# Patient Record
Sex: Female | Born: 1990 | Race: Black or African American | Hispanic: No | Marital: Single | Smoking: Never smoker
Health system: Southern US, Community
[De-identification: ages and names within clinical notes are randomized; demographics above are authoritative.]

## PROBLEM LIST (undated history)

## (undated) ENCOUNTER — Inpatient Hospital Stay (HOSPITAL_COMMUNITY): Payer: Self-pay

## (undated) DIAGNOSIS — O1495 Unspecified pre-eclampsia, complicating the puerperium: Secondary | ICD-10-CM

## (undated) DIAGNOSIS — O139 Gestational [pregnancy-induced] hypertension without significant proteinuria, unspecified trimester: Secondary | ICD-10-CM

## (undated) HISTORY — PX: MOLE REMOVAL: SHX2046

## (undated) HISTORY — PX: NO PAST SURGERIES: SHX2092

---
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
## 2011-09-05 ENCOUNTER — Other Ambulatory Visit: Payer: Self-pay | Admitting: Family Medicine

## 2011-09-05 ENCOUNTER — Other Ambulatory Visit (HOSPITAL_COMMUNITY)
Admission: RE | Admit: 2011-09-05 | Discharge: 2011-09-05 | Disposition: A | Discharge status: Home / Self Care | Payer: 59 | Source: Ambulatory Visit | Attending: Family Medicine | Admitting: Family Medicine

## 2011-09-05 DIAGNOSIS — Z124 Encounter for screening for malignant neoplasm of cervix: Secondary | ICD-10-CM | POA: Insufficient documentation

## 2011-09-05 DIAGNOSIS — Z113 Encounter for screening for infections with a predominantly sexual mode of transmission: Secondary | ICD-10-CM | POA: Insufficient documentation

## 2011-10-03 ENCOUNTER — Other Ambulatory Visit: Payer: Self-pay | Admitting: Family Medicine

## 2012-10-03 ENCOUNTER — Other Ambulatory Visit (HOSPITAL_COMMUNITY)
Admission: RE | Admit: 2012-10-03 | Discharge: 2012-10-03 | Disposition: A | Discharge status: Home / Self Care | Payer: 59 | Source: Ambulatory Visit | Attending: Family Medicine | Admitting: Family Medicine

## 2012-10-03 ENCOUNTER — Other Ambulatory Visit: Payer: Self-pay | Admitting: Family Medicine

## 2012-10-03 DIAGNOSIS — Z113 Encounter for screening for infections with a predominantly sexual mode of transmission: Secondary | ICD-10-CM | POA: Insufficient documentation

## 2012-10-03 DIAGNOSIS — Z124 Encounter for screening for malignant neoplasm of cervix: Secondary | ICD-10-CM | POA: Insufficient documentation

## 2013-01-10 ENCOUNTER — Ambulatory Visit (INDEPENDENT_AMBULATORY_CARE_PROVIDER_SITE_OTHER): Payer: 59 | Admitting: Gynecology

## 2013-01-10 ENCOUNTER — Encounter: Payer: Self-pay | Admitting: Gynecology

## 2013-01-10 VITALS — BP 128/80 | Ht 63.0 in | Wt 164.0 lb

## 2013-01-10 DIAGNOSIS — Z113 Encounter for screening for infections with a predominantly sexual mode of transmission: Secondary | ICD-10-CM

## 2013-01-10 DIAGNOSIS — N9089 Other specified noninflammatory disorders of vulva and perineum: Secondary | ICD-10-CM

## 2013-01-10 MED ORDER — FLUCONAZOLE 100 MG PO TABS: ORAL_TABLET | ORAL | Status: DC

## 2013-01-10 NOTE — Addendum Note (Signed)
Addended by: Ok Edwards on: 01/10/2013 04:52 PM   Modules accepted: Orders

## 2013-01-10 NOTE — Patient Instructions (Addendum)
Genital Herpes  Genital herpes is a sexually transmitted disease. This means that it is a disease passed by having sex with an infected person. There is no cure for genital herpes. The time between attacks can be months to years. The virus may live in a person but produce no problems (symptoms). This infection can be passed to a baby as it travels down the birth canal (vagina). In a newborn, this can cause central nervous system damage, eye damage, or even death. The virus that causes genital herpes is usually HSV-2 virus. The virus that causes oral herpes is usually HSV-1. The diagnosis (learning what is wrong) is made through culture results.  SYMPTOMS   Usually symptoms of pain and itching begin a few days to a week after contact. It first appears as small blisters that progress to small painful ulcers which then scab over and heal after several days. It affects the outer genitalia, birth canal, cervix, penis, anal area, buttocks, and thighs.  HOME CARE INSTRUCTIONS   · Keep ulcerated areas dry and clean.  · Take medications as directed. Antiviral medications can speed up healing. They will not prevent recurrences or cure this infection. These medications can also be taken for suppression if there are frequent recurrences.  · While the infection is active, it is contagious. Avoid all sexual contact during active infections.  · Condoms may help prevent spread of the herpes virus.  · Practice safe sex.  · Wash your hands thoroughly after touching the genital area.  · Avoid touching your eyes after touching your genital area.  · Inform your caregiver if you have had genital herpes and become pregnant. It is your responsibility to insure a safe outcome for your baby in this pregnancy.  · Only take over-the-counter or prescription medicines for pain, discomfort, or fever as directed by your caregiver.  SEEK MEDICAL CARE IF:   · You have a recurrence of this infection.  · You do not respond to medications and are not  improving.  · You have new sources of pain or discharge which have changed from the original infection.  · You have an oral temperature above 102° F (38.9° C).  · You develop abdominal pain.  · You develop eye pain or signs of eye infection.  Document Released: 05/06/2000 Document Revised: 08/01/2011 Document Reviewed: 05/27/2009  ExitCare® Patient Information ©2014 [employer].

## 2013-01-10 NOTE — Progress Notes (Signed)
The patient is a 22 year old new patient in the practice that came to the office for a second opinion. She stated that she had gone through 3 different physicians in the same practice not been able to tell her it actually is going wrong with her. She had been concerned about a vaginal discharge and some vaginal bumps that she had noted. It appears that they had done a herpes blood test which demonstrated that she had IgG present but negative for IgM. When we discussed in detail the clinical manifestations of herpes it did not appear that she had the typical symptoms. Her partner has never had herpes. 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 She denies any past history of any STDs. She is on oral contraceptive pills and is having normal menstrual cycle. They had treated her for what appears to be BV and yeast with terconazole vaginal cream which caused her some irritation as well as Flagyl 500 mg twice a day for 7 days.  We performed a detail colposcopic evaluation of the external genitalia to include the labia majora, minora, clitoral hood, perineum, perirectal region with no lesions seen after applying acetic acid. Afterwards the speculum was introduced into the vagina vaginal mucosa intact no lesion seen on the fornix or cervix even after application of acetic acid.  Assessment/plan: The patient was reassured there was no clinical evidence of active herpes normal female anatomy. She was instructed to finish her Flagyl 500 mg twice a day for 7 days. I will give her a prescription for Diflucan 150 mg she can take 1 by mouth today and stop the terconazole vaginal cream that may have irritated her somewhat. The transformation herpes was provided as well. The patient's recent Pap a few months ago at her primary physician's office was normal and also negative for GC and chlamydia. Patient was offered full STD screen and  she will come back for an HIV, RPR, hepatitis B and C.

## 2014-01-28 ENCOUNTER — Other Ambulatory Visit: Payer: Self-pay | Admitting: Family Medicine

## 2014-01-28 ENCOUNTER — Other Ambulatory Visit (HOSPITAL_COMMUNITY)
Admission: RE | Admit: 2014-01-28 | Discharge: 2014-01-28 | Disposition: A | Discharge status: Home / Self Care | Payer: 59 | Source: Ambulatory Visit | Attending: Family Medicine | Admitting: Family Medicine

## 2014-01-28 DIAGNOSIS — Z113 Encounter for screening for infections with a predominantly sexual mode of transmission: Secondary | ICD-10-CM | POA: Diagnosis present

## 2014-01-28 DIAGNOSIS — Z124 Encounter for screening for malignant neoplasm of cervix: Secondary | ICD-10-CM | POA: Diagnosis present

## 2014-01-31 LAB — CYTOLOGY - PAP

## 2015-02-02 ENCOUNTER — Other Ambulatory Visit: Payer: Self-pay | Admitting: Family Medicine

## 2015-02-02 ENCOUNTER — Other Ambulatory Visit (HOSPITAL_COMMUNITY)
Admission: RE | Admit: 2015-02-02 | Discharge: 2015-02-02 | Disposition: A | Discharge status: Home / Self Care | Payer: 59 | Source: Ambulatory Visit | Attending: Family Medicine | Admitting: Family Medicine

## 2015-02-02 DIAGNOSIS — Z01419 Encounter for gynecological examination (general) (routine) without abnormal findings: Secondary | ICD-10-CM | POA: Diagnosis not present

## 2015-02-02 DIAGNOSIS — Z113 Encounter for screening for infections with a predominantly sexual mode of transmission: Secondary | ICD-10-CM | POA: Diagnosis present

## 2015-02-04 LAB — CYTOLOGY - PAP

## 2015-07-28 ENCOUNTER — Encounter: Payer: Self-pay | Admitting: Women's Health

## 2015-07-28 ENCOUNTER — Ambulatory Visit (INDEPENDENT_AMBULATORY_CARE_PROVIDER_SITE_OTHER): Payer: 59 | Admitting: Women's Health

## 2015-07-28 VITALS — BP 132/86

## 2015-07-28 DIAGNOSIS — N898 Other specified noninflammatory disorders of vagina: Secondary | ICD-10-CM | POA: Diagnosis not present

## 2015-07-28 DIAGNOSIS — N760 Acute vaginitis: Secondary | ICD-10-CM | POA: Diagnosis not present

## 2015-07-28 DIAGNOSIS — N9089 Other specified noninflammatory disorders of vulva and perineum: Secondary | ICD-10-CM | POA: Diagnosis not present

## 2015-07-28 DIAGNOSIS — A499 Bacterial infection, unspecified: Secondary | ICD-10-CM

## 2015-07-28 DIAGNOSIS — B9689 Other specified bacterial agents as the cause of diseases classified elsewhere: Secondary | ICD-10-CM

## 2015-07-28 LAB — WET PREP FOR TRICH, YEAST, CLUE
Trich, Wet Prep: NONE SEEN
Yeast Wet Prep HPF POC: NONE SEEN

## 2015-07-28 MED ORDER — METRONIDAZOLE 0.75 % VA GEL: VAGINAL | Status: DC

## 2015-07-28 NOTE — Progress Notes (Signed)
Presents with complaint of increased vaginal discharge, probable BV has been treated in December, January with Flagyl tablets and feels like it did not resolve. Also questionable bump on labia. States had a blood test for HSV that was positive unsure of his HSV 1 or 2. Has not had any outbreaks. Same partner negative STD screen December 2016. Play cycle on OCs. Has had annual exams at primary care.  Exam: Appears well. External genitalia 2 cm folliculitis on mons pubis with slight pressure moderate amount of a white drainage, minimal erythema. Left labia 0.5 cm papule, HSV culture taken. Speculum exam copious white adherent discharge with odor noted, wet prep positive for many clues, TNTC bacteria. Bimanual no CMT or adnexal tenderness.  Unresolved Bacteria vaginosis Questionable HSV  Plan: MetroGel vaginal cream 1 applicator at bedtime 5, and then twice weekly for several weeks and then weekly for several weeks. Instructed to call if no relief of symptoms. Encouraged condoms. Alcohol precautions reviewed.

## 2015-07-28 NOTE — Patient Instructions (Signed)

## 2015-07-30 LAB — HERPES SIMPLEX VIRUS CULTURE: Organism ID, Bacteria: DETECTED

## 2015-07-31 ENCOUNTER — Other Ambulatory Visit: Payer: Self-pay | Admitting: Women's Health

## 2015-07-31 MED ORDER — VALACYCLOVIR HCL 500 MG PO TABS: ORAL_TABLET | ORAL | Status: DC

## 2015-08-03 ENCOUNTER — Telehealth: Payer: Self-pay | Admitting: *Deleted

## 2015-08-03 NOTE — Telephone Encounter (Signed)
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 Pt said she has questions if you could call her when you have a chance once you return to the office at [phone]. Please advise

## 2015-08-04 NOTE — Telephone Encounter (Signed)
Message left

## 2015-08-04 NOTE — Telephone Encounter (Signed)
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 Questions answered.

## 2015-10-01 ENCOUNTER — Ambulatory Visit
Admission: RE | Admit: 2015-10-01 | Discharge: 2015-10-01 | Disposition: A | Discharge status: Home / Self Care | Payer: 59 | Source: Ambulatory Visit | Attending: Physician Assistant | Admitting: Physician Assistant

## 2015-10-01 ENCOUNTER — Other Ambulatory Visit: Payer: Self-pay | Admitting: Physician Assistant

## 2015-10-01 DIAGNOSIS — M542 Cervicalgia: Secondary | ICD-10-CM

## 2016-02-24 ENCOUNTER — Other Ambulatory Visit (HOSPITAL_COMMUNITY)
Admission: RE | Admit: 2016-02-24 | Discharge: 2016-02-24 | Disposition: A | Discharge status: Home / Self Care | Payer: 59 | Source: Ambulatory Visit | Attending: Family Medicine | Admitting: Family Medicine

## 2016-02-24 ENCOUNTER — Other Ambulatory Visit: Payer: Self-pay | Admitting: Family Medicine

## 2016-02-24 DIAGNOSIS — Z01419 Encounter for gynecological examination (general) (routine) without abnormal findings: Secondary | ICD-10-CM | POA: Diagnosis not present

## 2016-02-24 DIAGNOSIS — Z113 Encounter for screening for infections with a predominantly sexual mode of transmission: Secondary | ICD-10-CM | POA: Diagnosis present

## 2016-02-24 LAB — OB RESULTS CONSOLE HIV ANTIBODY (ROUTINE TESTING): HIV: NONREACTIVE

## 2016-02-26 LAB — CYTOLOGY - PAP

## 2016-03-30 LAB — OB RESULTS CONSOLE ANTIBODY SCREEN: Antibody Screen: NEGATIVE

## 2016-03-30 LAB — OB RESULTS CONSOLE ABO/RH: RH Type: POSITIVE

## 2016-03-30 LAB — OB RESULTS CONSOLE RPR: RPR: NONREACTIVE

## 2016-03-30 LAB — OB RESULTS CONSOLE HEPATITIS B SURFACE ANTIGEN: Hepatitis B Surface Ag: NEGATIVE

## 2016-03-31 LAB — OB RESULTS CONSOLE RUBELLA ANTIBODY, IGM: Rubella: IMMUNE

## 2016-05-23 NOTE — L&D Delivery Note (Addendum)
Delivery Note At 5:44 PM a viable female was delivered via Vaginal, Spontaneous Delivery (Presentation: Direct OA ).  APGAR: , 9; weight pending.   Placenta status: intact, spontaneous.  Sent to pathology Cord:  with the following complications: Tight nuchal cord, surgically reduced .  Cord pH: pending, art/ven  Anesthesia:  Epidural Episiotomy: None Lacerations:   Suture Repair: 3.0 vicryl Est. Blood Loss (mL): 200 UOP 900 since 7 am.  Code APGAR called.  Baby floppy, pale. Had some fetal movement.  Pt appeared somnolent.  Stat Magnesium ordered.  Mom to postpartum.  Baby to Couplet care / Skin to Skin.  Parents desire circumcision.  Baby will be on father's insurance.  Andrea Bradley, Andrea Bradley 10/21/2016, 6:22 PM

## 2016-09-28 ENCOUNTER — Inpatient Hospital Stay (HOSPITAL_COMMUNITY)
Admission: AD | Admit: 2016-09-28 | Discharge: 2016-09-28 | Disposition: A | Discharge status: Home / Self Care | Payer: BC Managed Care – PPO | Source: Ambulatory Visit | Attending: Obstetrics and Gynecology | Admitting: Obstetrics and Gynecology

## 2016-09-28 ENCOUNTER — Encounter: Payer: Self-pay | Admitting: Student

## 2016-09-28 DIAGNOSIS — O368130 Decreased fetal movements, third trimester, not applicable or unspecified: Secondary | ICD-10-CM | POA: Diagnosis present

## 2016-09-28 DIAGNOSIS — Z3A35 35 weeks gestation of pregnancy: Secondary | ICD-10-CM | POA: Insufficient documentation

## 2016-09-28 DIAGNOSIS — R030 Elevated blood-pressure reading, without diagnosis of hypertension: Secondary | ICD-10-CM

## 2016-09-28 DIAGNOSIS — Z3689 Encounter for other specified antenatal screening: Secondary | ICD-10-CM

## 2016-09-28 LAB — URINALYSIS, ROUTINE W REFLEX MICROSCOPIC
Bacteria, UA: NONE SEEN
Bilirubin Urine: NEGATIVE
Glucose, UA: >=500 mg/dL — AB
Hgb urine dipstick: NEGATIVE
Ketones, ur: 5 mg/dL — AB
Leukocytes, UA: NEGATIVE
Nitrite: NEGATIVE
Protein, ur: 30 mg/dL — AB
Specific Gravity, Urine: 1.025 (ref 1.005–1.030)
pH: 6 (ref 5.0–8.0)

## 2016-09-28 LAB — COMPREHENSIVE METABOLIC PANEL
ALT: 12 U/L — ABNORMAL LOW (ref 14–54)
AST: 24 U/L (ref 15–41)
Albumin: 2.7 g/dL — ABNORMAL LOW (ref 3.5–5.0)
Alkaline Phosphatase: 118 U/L (ref 38–126)
Anion gap: 7 (ref 5–15)
BUN: 7 mg/dL (ref 6–20)
CO2: 23 mmol/L (ref 22–32)
Calcium: 8.9 mg/dL (ref 8.9–10.3)
Chloride: 106 mmol/L (ref 101–111)
Creatinine, Ser: 0.62 mg/dL (ref 0.44–1.00)
GFR calc Af Amer: >60 mL/min (ref >60)
GFR calc non Af Amer: >60 mL/min (ref >60)
Glucose, Bld: 116 mg/dL — ABNORMAL HIGH (ref 65–99)
Potassium: 3.6 mmol/L (ref 3.5–5.1)
Sodium: 136 mmol/L (ref 135–145)
Total Bilirubin: 0.3 mg/dL (ref 0.3–1.2)
Total Protein: 6.4 g/dL — ABNORMAL LOW (ref 6.5–8.1)

## 2016-09-28 LAB — PROTEIN / CREATININE RATIO, URINE
Creatinine, Urine: 236 mg/dL
Protein Creatinine Ratio: 0.13 mg/mg{Cre} (ref 0.00–0.15)
Total Protein, Urine: 30 mg/dL

## 2016-09-28 LAB — CBC
HCT: 33.2 % — ABNORMAL LOW (ref 36.0–46.0)
Hemoglobin: 11.6 g/dL — ABNORMAL LOW (ref 12.0–15.0)
MCH: 32 pg (ref 26.0–34.0)
MCHC: 34.9 g/dL (ref 30.0–36.0)
MCV: 91.7 fL (ref 78.0–100.0)
Platelets: 247 10*3/uL (ref 150–400)
RBC: 3.62 MIL/uL — ABNORMAL LOW (ref 3.87–5.11)
RDW: 13 % (ref 11.5–15.5)
WBC: 7.6 10*3/uL (ref 4.0–10.5)

## 2016-09-28 NOTE — Discharge Instructions (Signed)
Fetal Movement Counts °Patient Name: ________________________________________________ Patient Due Date: ____________________ °What is a fetal movement count? °A fetal movement count is the number of times that you feel your baby move during a certain amount of time. This may also be called a fetal kick count. A fetal movement count is recommended for every pregnant woman. You may be asked to start counting fetal movements as early as week 28 of your pregnancy. °Pay attention to when your baby is most active. You may notice your baby's sleep and wake cycles. You may also notice things that make your baby move more. You should do a fetal movement count: °· When your baby is normally most active. °· At the same time each day. °A good time to count movements is while you are resting, after having something to eat and drink. °How do I count fetal movements? °1. Find a quiet, comfortable area. Sit, or lie down on your side. °2. Write down the date, the start time and stop time, and the number of movements that you felt between those two times. Take this information with you to your health care visits. °3. For 2 hours, count kicks, flutters, swishes, rolls, and jabs. You should feel at least 10 movements during 2 hours. °4. You may stop counting after you have felt 10 movements. °5. If you do not feel 10 movements in 2 hours, have something to eat and drink. Then, keep resting and counting for 1 hour. If you feel at least 4 movements during that hour, you may stop counting. °Contact a health care provider if: °· You feel fewer than 4 movements in 2 hours. °· Your baby is not moving like he or she usually does. °Date: ____________ Start time: ____________ Stop time: ____________ Movements: ____________ °Date: ____________ Start time: ____________ Stop time: ____________ Movements: ____________ °Date: ____________ Start time: ____________ Stop time: ____________ Movements: ____________ °Date: ____________ Start time:  ____________ Stop time: ____________ Movements: ____________ °Date: ____________ Start time: ____________ Stop time: ____________ Movements: ____________ °Date: ____________ Start time: ____________ Stop time: ____________ Movements: ____________ °Date: ____________ Start time: ____________ Stop time: ____________ Movements: ____________ °Date: ____________ Start time: ____________ Stop time: ____________ Movements: ____________ °Date: ____________ Start time: ____________ Stop time: ____________ Movements: ____________ °This information is not intended to replace advice given to you by your health care provider. Make sure you discuss any questions you have with your health care provider. °Document Released: 06/08/2006 Document Revised: 01/06/2016 Document Reviewed: 06/18/2015 °Elsevier Interactive Patient Education © 2017 [employer] °Hypertension During Pregnancy °Hypertension, commonly called high blood pressure, is when the force of blood pumping through your arteries is too strong. Arteries are blood vessels that carry blood from the heart throughout the body. Hypertension during pregnancy can cause problems for you and your baby. Your baby may be born early (prematurely) or may not weigh as much as he or she should at birth. Very bad cases of hypertension during pregnancy can be life-threatening. °Different types of hypertension can occur during pregnancy. These include: °· Chronic hypertension. This happens when: °¨ You have hypertension before pregnancy and it continues during pregnancy. °¨ You develop hypertension before you are [redacted] weeks pregnant, and it continues during pregnancy. °· Gestational hypertension. This is hypertension that develops after the 20th week of pregnancy. °· Preeclampsia, also called toxemia of pregnancy. This is a very serious type of hypertension that develops only during pregnancy. It affects the whole body, and it can be very dangerous for you and your baby. °Gestational    hypertension and preeclampsia usually go away within 6 weeks after your baby is born. Women who have hypertension during pregnancy have a greater chance of developing hypertension later in life or during future pregnancies. °What are the causes? °The exact cause of hypertension is not known. °What increases the risk? °There are certain factors that make it more likely for you to develop hypertension during pregnancy. These include: °· Having hypertension during a previous pregnancy or prior to pregnancy. °· Being overweight. °· Being older than age 40. °· Being pregnant for the first time or being pregnant with more than one baby. °· Becoming pregnant using fertilization methods such as IVF (in vitro fertilization). °· Having diabetes, kidney problems, or systemic lupus erythematosus. °· Having a family history of hypertension. °What are the signs or symptoms? °Chronic hypertension and gestational hypertension rarely cause symptoms. Preeclampsia causes symptoms, which may include: °· Increased protein in your urine. Your health care provider will check for this at every visit before you give birth (prenatal visit). °· Severe headaches. °· Sudden weight gain. °· Swelling of the hands, face, legs, and feet. °· Nausea and vomiting. °· Vision problems, such as blurred or double vision. °· Numbness in the face, arms, legs, and feet. °· Dizziness. °· Slurred speech. °· Sensitivity to bright lights. °· Abdominal pain. °· Convulsions. °How is this diagnosed? °You may be diagnosed with hypertension during a routine prenatal exam. At each prenatal visit, you may: °· Have a urine test to check for high amounts of protein in your urine. °· Have your blood pressure checked. A blood pressure reading is recorded as two numbers, such as "120 over 80" (or 120/80). The first ("top") number is called the systolic pressure. It is a measure of the pressure in your arteries when your heart beats. The second ("bottom") number is called  the diastolic pressure. It is a measure of the pressure in your arteries as your heart relaxes between beats. Blood pressure is measured in a unit called mm Hg. A normal blood pressure reading is: °¨ Systolic: below 120. °¨ Diastolic: below 80. °The type of hypertension that you are diagnosed with depends on your test results and when your symptoms developed. °· Chronic hypertension is usually diagnosed before 20 weeks of pregnancy. °· Gestational hypertension is usually diagnosed after 20 weeks of pregnancy. °· Hypertension with high amounts of protein in the urine is diagnosed as preeclampsia. °· Blood pressure measurements that stay above 160 systolic, or above 110 diastolic, are signs of severe preeclampsia. °How is this treated? °Treatment for hypertension during pregnancy varies depending on the type of hypertension you have and how serious it is. °· If you take medicines called ACE inhibitors to treat chronic hypertension, you may need to switch medicines. ACE inhibitors should not be taken during pregnancy. °· If you have gestational hypertension, you may need to take blood pressure medicine. °· If you are at risk for preeclampsia, your health care provider may recommend that you take a low-dose aspirin every day to prevent high blood pressure during your pregnancy. °· If you have severe preeclampsia, you may need to be hospitalized so you and your baby can be monitored closely. You may also need to take medicine (magnesium sulfate) to prevent seizures and to lower blood pressure. This medicine may be given as an injection or through an IV tube. °· In some cases, if your condition gets worse, you may need to deliver your baby early. °Follow these instructions at home: °Eating and drinking  °·   Drink enough fluid to keep your urine clear or pale yellow. °· Eat a healthy diet that is low in salt (sodium). Do not add salt to your food. Check food labels to see how much sodium a food or beverage  contains. °Lifestyle  °· Do not use any products that contain nicotine or tobacco, such as cigarettes and e-cigarettes. If you need help quitting, ask your health care provider. °· Do not use alcohol. °· Avoid caffeine. °· Avoid stress as much as possible. Rest and get plenty of sleep. °General instructions  °· Take over-the-counter and prescription medicines only as told by your health care provider. °· While lying down, lie on your left side. This keeps pressure off your baby. °· While sitting or lying down, raise (elevate) your feet. Try putting some pillows under your lower legs. °· Exercise regularly. Ask your health care provider what kinds of exercise are best for you. °· Keep all prenatal and follow-up visits as told by your health care provider. This is important. °Contact a health care provider if: °· You have symptoms that your health care provider told you may require more treatment or monitoring, such as: °¨ Fever. °¨ Vomiting. °¨ Headache. °Get help right away if: °· You have severe abdominal pain or vomiting that does not get better with treatment. °· You suddenly develop swelling in your hands, ankles, or face. °· You gain 4 lbs (1.8 kg) or more in 1 week. °· You develop vaginal bleeding, or you have blood in your urine. °· You do not feel your baby moving as much as usual. °· You have blurred or double vision. °· You have muscle twitching or sudden tightening (spasms). °· You have shortness of breath. °· Your lips or fingernails turn blue. °This information is not intended to replace advice given to you by your health care provider. Make sure you discuss any questions you have with your health care provider. °Document Released: 01/25/2011 Document Revised: 11/27/2015 Document Reviewed: 10/23/2015 °Elsevier Interactive Patient Education © 2017 Else[employer] Inc. ° °

## 2016-09-28 NOTE — MAU Note (Signed)
Has not felt baby move today, less yesterday, none today. No pain, bleeding or leaking. No problems with preg

## 2016-09-28 NOTE — MAU Note (Signed)
Urine in lab 

## 2016-09-28 NOTE — MAU Provider Note (Signed)
History     CSN: [id number]  Arrival date and time: 09/28/16 1746  First Provider Initiated Contact with Patient 09/28/16 1849      Chief Complaint  Patient presents with  . Decreased Fetal Movement   HPI Andrea Bradley is a 26 y.o. G1P0 at [redacted]w[redacted]d who presents with decreased fetal movement. Symptoms began yesterday. States has felt no fetal movement today; last felt baby move yesterday.  Denies history of hypertension. Denies abdominal pain, vaginal bleeding, LOF, headache, visual disturbance, or epigastric pain.   OB History    Gravida Para Term Preterm AB Living   1             SAB TAB Ectopic Multiple Live Births                  History reviewed. No pertinent past medical history.  Past Surgical History:  Procedure Laterality Date  . MOLE REMOVAL      Family History  Problem Relation Age of Onset  . Hypertension Maternal Grandmother   . Diabetes Maternal Grandmother   . Diabetes Maternal Grandfather     Social History  Substance Use Topics  . Smoking status: Never Smoker  . Smokeless tobacco: Never Used  . Alcohol use Yes     Comment: SOCIAL    Allergies: No Known Allergies  Prescriptions Prior to Admission  Medication Sig Dispense Refill Last Dose  . fluconazole (DIFLUCAN) 100 MG tablet Take one tablet today may repeat in 2 days (Patient not taking: Reported on 07/28/2015) 2 tablet 1 Not Taking  . metroNIDAZOLE (FLAGYL) 500 MG tablet Take 500 mg by mouth 3 (three) times daily. Reported on 07/28/2015   Not Taking  . metroNIDAZOLE (METROGEL VAGINAL) 0.75 % vaginal gel 1 applicator per vagina at HS x 5 then 2x wk for 3 weeks, then weekly 140 g 1   . norethindrone-ethinyl estradiol (JUNEL FE,GILDESS FE,LOESTRIN FE) 1-20 MG-MCG tablet Take 1 tablet by mouth daily. Reported on 07/28/2015   Not Taking  . terconazole (TERAZOL 7) 0.4 % vaginal cream Place 1 applicator vaginally at bedtime. Reported on 07/28/2015   Not Taking  . valACYclovir (VALTREX) 500 MG tablet Take one  tablet twice daily for 3-5 days for outbreak then as needed daily 30 tablet 12     Review of Systems  Constitutional: Negative.   Eyes: Negative for visual disturbance.  Gastrointestinal: Negative for abdominal pain.  Genitourinary: Negative.   Neurological: Negative for headaches.   Physical Exam  Dilation: 1 Effacement (%): 60 Cervical Position: Middle Station: -2 Presentation: Vertex Exam by:: Kayren Eaves RN    Blood pressure 133/84, pulse 95, temperature 98.3 F (36.8 C), temperature source Oral, resp. rate 18, weight 177 lb 12 oz (80.6 kg), last menstrual period 01/26/2016, SpO2 100 %.  Temp:  [98.3 F (36.8 C)] 98.3 F (36.8 C) (05/09 1756) Pulse Rate:  [88-114] 95 (05/09 2000) Resp:  [18] 18 (05/09 1756) BP: (129-145)/(81-93) 133/84 (05/09 2000) SpO2:  [100 %] 100 % (05/09 1756) Weight:  [177 lb 12 oz (80.6 kg)] 177 lb 12 oz (80.6 kg) (05/09 1756)  Physical Exam  Nursing note and vitals reviewed. Constitutional: She is oriented to person, place, and time. She appears well-developed and well-nourished. No distress.  HENT:  Head: Normocephalic and atraumatic.  Eyes: Conjunctivae are normal. Right eye exhibits no discharge. Left eye exhibits no discharge. No scleral icterus.  Neck: Normal range of motion.  Cardiovascular: Normal rate, regular rhythm and normal heart sounds.  No murmur heard. Respiratory: Effort normal and breath sounds normal. No respiratory distress. She has no wheezes.  GI: Soft. Bowel sounds are normal. There is no tenderness.  Neurological: She is alert and oriented to person, place, and time. She has normal reflexes.  No clonus  Skin: Skin is warm and dry. She is not diaphoretic.  Psychiatric: She has a normal mood and affect. Her behavior is normal. Judgment and thought content normal.   Fetal Tracing:  Baseline: 150 Variability: moderate Accelerations: 15x15 Decelerations: none  Toco: Q3-5 mins -- resolved prior to discharge MAU  Course  Procedures Results for orders placed or performed during the hospital encounter of 09/28/16 (from the past 24 hour(s))  Protein / creatinine ratio, urine     Status: None   Collection Time: 09/28/16  6:00 PM  Result Value Ref Range   Creatinine, Urine 236.00 mg/dL   Total Protein, Urine 30 mg/dL   Protein Creatinine Ratio 0.13 0.00 - 0.15 mg/mg[Cre]  Urinalysis, Routine w reflex microscopic     Status: Abnormal   Collection Time: 09/28/16  6:00 PM  Result Value Ref Range   Color, Urine YELLOW YELLOW   APPearance CLEAR CLEAR   Specific Gravity, Urine 1.025 1.005 - 1.030   pH 6.0 5.0 - 8.0   Glucose, UA >=500 (A) NEGATIVE mg/dL   Hgb urine dipstick NEGATIVE NEGATIVE   Bilirubin Urine NEGATIVE NEGATIVE   Ketones, ur 5 (A) NEGATIVE mg/dL   Protein, ur 30 (A) NEGATIVE mg/dL   Nitrite NEGATIVE NEGATIVE   Leukocytes, UA NEGATIVE NEGATIVE   RBC / HPF 0-5 0 - 5 RBC/hpf   WBC, UA 0-5 0 - 5 WBC/hpf   Bacteria, UA NONE SEEN NONE SEEN   Squamous Epithelial / LPF 0-5 (A) NONE SEEN   Mucous PRESENT   CBC     Status: Abnormal   Collection Time: 09/28/16  7:03 PM  Result Value Ref Range   WBC 7.6 4.0 - 10.5 K/uL   RBC 3.62 (L) 3.87 - 5.11 MIL/uL   Hemoglobin 11.6 (L) 12.0 - 15.0 g/dL   HCT 21.3 (L) 08.6 - 57.8 %   MCV 91.7 78.0 - 100.0 fL   MCH 32.0 26.0 - 34.0 pg   MCHC 34.9 30.0 - 36.0 g/dL   RDW 46.9 62.9 - 52.8 %   Platelets 247 150 - 400 K/uL  Comprehensive metabolic panel     Status: Abnormal   Collection Time: 09/28/16  7:03 PM  Result Value Ref Range   Sodium 136 135 - 145 mmol/L   Potassium 3.6 3.5 - 5.1 mmol/L   Chloride 106 101 - 111 mmol/L   CO2 23 22 - 32 mmol/L   Glucose, Bld 116 (H) 65 - 99 mg/dL   BUN 7 6 - 20 mg/dL   Creatinine, Ser 4.13 0.44 - 1.00 mg/dL   Calcium 8.9 8.9 - 24.4 mg/dL   Total Protein 6.4 (L) 6.5 - 8.1 g/dL   Albumin 2.7 (L) 3.5 - 5.0 g/dL   AST 24 15 - 41 U/L   ALT 12 (L) 14 - 54 U/L   Alkaline Phosphatase 118 38 - 126 U/L   Total  Bilirubin 0.3 0.3 - 1.2 mg/dL   GFR calc non Af Amer >60 >60 mL/min   GFR calc Af Amer >60 >60 mL/min   Anion gap 7 5 - 15    MDM Reactive fetal tracing -- pt reports feeling fetal movement since being monitored Ctx every 3-5 minutes -- pt  doesn't feel contractions. SVE unchanged while in MAU & ctx spaced out prior to discharge Elevated BP -- none severe range -- BPs came down while labs pending PIH labs wnl & urine PCR 0.13 S/w Dr. Richardson Doppole. Ok to discharge home Assessment and Plan  A; 1. Decreased fetal movements in third trimester, single or unspecified fetus   2. Elevated BP without diagnosis of hypertension   3. NST (non-stress test) reactive    P: Discharge home Fetal kick counts Discussed reasons to return to MAU including s/s preeclampsia Keep scheduled appointment with Dr. Richardson Doppole on Friday  Judeth HornErin Darin Arndt 09/28/2016, 6:39 PM

## 2016-10-05 ENCOUNTER — Encounter: Payer: Self-pay | Admitting: Gynecology

## 2016-10-07 LAB — OB RESULTS CONSOLE GBS: GBS: NEGATIVE

## 2016-10-20 ENCOUNTER — Encounter (HOSPITAL_COMMUNITY): Payer: Self-pay | Admitting: Obstetrics

## 2016-10-20 ENCOUNTER — Inpatient Hospital Stay (HOSPITAL_COMMUNITY)
Admission: AD | Admit: 2016-10-20 | Discharge: 2016-10-23 | DRG: 775 | Disposition: A | Discharge status: Home / Self Care | Payer: BC Managed Care – PPO | Source: Ambulatory Visit | Attending: Obstetrics and Gynecology | Admitting: Obstetrics and Gynecology

## 2016-10-20 DIAGNOSIS — Z3A38 38 weeks gestation of pregnancy: Secondary | ICD-10-CM

## 2016-10-20 DIAGNOSIS — O1493 Unspecified pre-eclampsia, third trimester: Secondary | ICD-10-CM

## 2016-10-20 DIAGNOSIS — O1404 Mild to moderate pre-eclampsia, complicating childbirth: Secondary | ICD-10-CM | POA: Diagnosis present

## 2016-10-20 DIAGNOSIS — O1494 Unspecified pre-eclampsia, complicating childbirth: Secondary | ICD-10-CM | POA: Diagnosis present

## 2016-10-20 LAB — COMPREHENSIVE METABOLIC PANEL
ALT: 16 U/L (ref 14–54)
AST: 30 U/L (ref 15–41)
Albumin: 2.8 g/dL — ABNORMAL LOW (ref 3.5–5.0)
Alkaline Phosphatase: 158 U/L — ABNORMAL HIGH (ref 38–126)
Anion gap: 6 (ref 5–15)
BUN: 11 mg/dL (ref 6–20)
CO2: 24 mmol/L (ref 22–32)
Calcium: 9.3 mg/dL (ref 8.9–10.3)
Chloride: 105 mmol/L (ref 101–111)
Creatinine, Ser: 0.72 mg/dL (ref 0.44–1.00)
GFR calc Af Amer: >60 mL/min (ref >60)
GFR calc non Af Amer: >60 mL/min (ref >60)
Glucose, Bld: 68 mg/dL (ref 65–99)
Potassium: 4.2 mmol/L (ref 3.5–5.1)
Sodium: 135 mmol/L (ref 135–145)
Total Bilirubin: 0.3 mg/dL (ref 0.3–1.2)
Total Protein: 6.7 g/dL (ref 6.5–8.1)

## 2016-10-20 LAB — URIC ACID: Uric Acid, Serum: 6 mg/dL (ref 2.3–6.6)

## 2016-10-20 LAB — CBC
HCT: 33.2 % — ABNORMAL LOW (ref 36.0–46.0)
Hemoglobin: 11.9 g/dL — ABNORMAL LOW (ref 12.0–15.0)
MCH: 32.4 pg (ref 26.0–34.0)
MCHC: 35.8 g/dL (ref 30.0–36.0)
MCV: 90.5 fL (ref 78.0–100.0)
Platelets: 251 10*3/uL (ref 150–400)
RBC: 3.67 MIL/uL — ABNORMAL LOW (ref 3.87–5.11)
RDW: 13.8 % (ref 11.5–15.5)
WBC: 6.8 10*3/uL (ref 4.0–10.5)

## 2016-10-20 LAB — ABO/RH: ABO/RH(D): A POS

## 2016-10-20 LAB — TYPE AND SCREEN
ABO/RH(D): A POS
Antibody Screen: NEGATIVE

## 2016-10-20 LAB — PROTEIN / CREATININE RATIO, URINE
Creatinine, Urine: 180 mg/dL
Protein Creatinine Ratio: 0.44 mg/mg{Cre} — ABNORMAL HIGH (ref 0.00–0.15)
Total Protein, Urine: 80 mg/dL

## 2016-10-20 LAB — LACTATE DEHYDROGENASE: LDH: 183 U/L (ref 98–192)

## 2016-10-20 MED ORDER — LIDOCAINE HCL (PF) 1 % IJ SOLN
30.0000 mL | INTRAMUSCULAR | Status: AC | PRN
  Administered 2016-10-21: 30 mL via SUBCUTANEOUS
  Filled 2016-10-20: qty 30

## 2016-10-20 MED ORDER — ONDANSETRON HCL 4 MG/2ML IJ SOLN: 4.0000 mg | Freq: Four times a day (QID) | INTRAMUSCULAR | Status: DC | PRN

## 2016-10-20 MED ORDER — TERBUTALINE SULFATE 1 MG/ML IJ SOLN
0.2500 mg | Freq: Once | INTRAMUSCULAR | Status: AC | PRN
  Administered 2016-10-21: 0.25 mg via SUBCUTANEOUS

## 2016-10-20 MED ORDER — OXYCODONE-ACETAMINOPHEN 5-325 MG PO TABS: 1.0000 | ORAL_TABLET | ORAL | Status: DC | PRN

## 2016-10-20 MED ORDER — LABETALOL HCL 5 MG/ML IV SOLN
20.0000 mg | INTRAVENOUS | Status: AC | PRN
  Administered 2016-10-21 (×2): 20 mg via INTRAVENOUS
  Administered 2016-10-21: 40 mg via INTRAVENOUS
  Filled 2016-10-20: qty 8
  Filled 2016-10-20 (×2): qty 4

## 2016-10-20 MED ORDER — LACTATED RINGERS IV SOLN
INTRAVENOUS | Status: DC
  Administered 2016-10-20 – 2016-10-21 (×4): via INTRAVENOUS

## 2016-10-20 MED ORDER — SOD CITRATE-CITRIC ACID 500-334 MG/5ML PO SOLN: 30.0000 mL | ORAL | Status: DC | PRN

## 2016-10-20 MED ORDER — OXYCODONE-ACETAMINOPHEN 5-325 MG PO TABS: 2.0000 | ORAL_TABLET | ORAL | Status: DC | PRN

## 2016-10-20 MED ORDER — OXYTOCIN 40 UNITS IN LACTATED RINGERS INFUSION - SIMPLE MED
1.0000 m[IU]/min | INTRAVENOUS | Status: DC
  Administered 2016-10-20: 1 m[IU]/min via INTRAVENOUS
  Administered 2016-10-21: 11 m[IU]/min via INTRAVENOUS
  Filled 2016-10-20 (×2): qty 1000

## 2016-10-20 MED ORDER — HYDRALAZINE HCL 20 MG/ML IJ SOLN
10.0000 mg | Freq: Once | INTRAMUSCULAR | Status: DC | PRN
  Filled 2016-10-20: qty 1

## 2016-10-20 MED ORDER — OXYTOCIN BOLUS FROM INFUSION
500.0000 mL | Freq: Once | INTRAVENOUS | Status: AC
  Administered 2016-10-21: 500 mL via INTRAVENOUS

## 2016-10-20 MED ORDER — TERBUTALINE SULFATE 1 MG/ML IJ SOLN
0.2500 mg | Freq: Once | INTRAMUSCULAR | Status: AC | PRN
  Administered 2016-10-21: 0.25 mg via SUBCUTANEOUS
  Filled 2016-10-20 (×2): qty 1

## 2016-10-20 MED ORDER — MISOPROSTOL 25 MCG QUARTER TABLET
25.0000 ug | ORAL_TABLET | ORAL | Status: DC | PRN
  Administered 2016-10-20: 25 ug via VAGINAL
  Filled 2016-10-20 (×2): qty 1

## 2016-10-20 MED ORDER — OXYTOCIN 40 UNITS IN LACTATED RINGERS INFUSION - SIMPLE MED
2.5000 [IU]/h | INTRAVENOUS | Status: DC
Start: 2016-10-20 — End: 2016-10-22

## 2016-10-20 MED ORDER — LACTATED RINGERS IV SOLN
500.0000 mL | INTRAVENOUS | Status: DC | PRN
  Administered 2016-10-21: 200 mL via INTRAVENOUS

## 2016-10-20 MED ORDER — ACETAMINOPHEN 325 MG PO TABS: 650.0000 mg | ORAL_TABLET | ORAL | Status: DC | PRN

## 2016-10-20 NOTE — Anesthesia Pain Management Evaluation Note (Signed)
  CRNA Pain Management Visit Note  Patient: Andrea Bradley, 26 y.o., female  "Hello I am a member of the anesthesia team at [facility]. We have an anesthesia team available at all times to provide care throughout the hospital, including epidural management and anesthesia for C-section. I don't know your plan for the delivery whether it a natural birth, water birth, IV sedation, nitrous supplementation, doula or epidural, but we want to meet your pain goals."   1.Was your pain managed to your expectations on prior hospitalizations?   No prior hospitalizations  2.What is your expectation for pain management during this hospitalization?     Labor support without medications  3.How can we help you reach that goal? Be available if needed  Record the patient's initial score and the patient's pain goal.   Pain: 0  Pain Goal: 8 [facility] wants you to be able to say your pain was always managed very well.  Irina Okelly 10/20/2016

## 2016-10-20 NOTE — Progress Notes (Signed)
Andrea Bradley MRN: [id number]  Subjective: -Care assumed of 26 y.o. G1P0 at [redacted]w[redacted]d who presents for IOL s/t PreEclampsia w/o SF.  Report received from Dr. Delrae Sawyers who patient is attended by.  Pregnancy history otherwise unremarkable.  In room to meet acquaintance of patient and family.  Patient denies HA, Visual disturbances, Epigastric Pain, or SOB.  Reports no perception of contractions and verbalizes desire to avoid epidural if possible.  Questions availability of other medications.   Objective: BP (!) 157/93 (BP Location: Left Arm)   Pulse 66   Temp 98.4 F (36.9 C) (Oral)   Resp 17   Ht 5\' 2"  (1.575 m)   Wt 83.9 kg (185 lb)   LMP 01/26/2016 (Exact Date)   BMI 33.84 kg/m  No intake/output data recorded. No intake/output data recorded.   Results for orders placed or performed during the hospital encounter of 10/20/16 (from the past 24 hour(s))  CBC     Status: Abnormal   Collection Time: 10/20/16  6:10 PM  Result Value Ref Range   WBC 6.8 4.0 - 10.5 K/uL   RBC 3.67 (L) 3.87 - 5.11 MIL/uL   Hemoglobin 11.9 (L) 12.0 - 15.0 g/dL   HCT 40.9 (L) 81.1 - 91.4 %   MCV 90.5 78.0 - 100.0 fL   MCH 32.4 26.0 - 34.0 pg   MCHC 35.8 30.0 - 36.0 g/dL   RDW 78.2 95.6 - 21.3 %   Platelets 251 150 - 400 K/uL  Type and screen [facility] [place]     Status: None   Collection Time: 10/20/16  6:10 PM  Result Value Ref Range   ABO/RH(D) A POS    Antibody Screen NEG    Sample Expiration 10/23/2016   Comprehensive metabolic panel     Status: Abnormal   Collection Time: 10/20/16  6:10 PM  Result Value Ref Range   Sodium 135 135 - 145 mmol/L   Potassium 4.2 3.5 - 5.1 mmol/L   Chloride 105 101 - 111 mmol/L   CO2 24 22 - 32 mmol/L   Glucose, Bld 68 65 - 99 mg/dL   BUN 11 6 - 20 mg/dL   Creatinine, Ser 0.86 0.44 - 1.00 mg/dL   Calcium 9.3 8.9 - 57.8 mg/dL   Total Protein 6.7 6.5 - 8.1 g/dL   Albumin 2.8 (L) 3.5 - 5.0 g/dL   AST 30 15 - 41 U/L   ALT 16 14 - 54 U/L   Alkaline  Phosphatase 158 (H) 38 - 126 U/L   Total Bilirubin 0.3 0.3 - 1.2 mg/dL   GFR calc non Af Amer >60 >60 mL/min   GFR calc Af Amer >60 >60 mL/min   Anion gap 6 5 - 15  Lactate dehydrogenase     Status: None   Collection Time: 10/20/16  6:10 PM  Result Value Ref Range   LDH 183 98 - 192 U/L  Uric acid     Status: None   Collection Time: 10/20/16  6:10 PM  Result Value Ref Range   Uric Acid, Serum 6.0 2.3 - 6.6 mg/dL     Fetal Monitoring: FHT: 145 bpm, Mod Var, -Decels, +Accels UC: Q4-60min, palpates mild    Physical Exam: General appearance: alert, well appearing, and in no distress. Chest: normal rate and regular rhythm.  clear to auscultation, no wheezes, rales or rhonchi, symmetric air entry. Abdominal exam: Soft RT, NT, bowel sounds normal, Gravid Extremities: +3 Pitting Edema of BLE Skin exam: Warm Dry  Vaginal Exam: SVE:   Dilation: 1.5 Effacement (%): 70, 80 Station: -2 Exam by:: stone rnc Membranes:Intact Internal Monitors: None  Augmentation/Induction: Pitocin:None Cytotec: 1st Dose at 1855  Assessment:  IUP at 38.2wks Cat I FT  PreEclampsia GBS Negative  Plan: -Discussed POC for tonight to include: *Initiation of pitocin after cytotec dosing per Dr. Richardson Doppole *Possibility of AROM, when cervix in optimal position -Discussed AROM r/b including increased risk of infection, cord prolapse, fetal intolerance, and decreased labor time. Questions and concerns addressed *Initiation of IV hypertensive medications if necessary -Availability of IV pain medication and epidural if desired -Informed of possibility of MgSO4 initiation in PPP  -Encouraged to rest -Will reassess as appropriate -Continue other mgmt as ordered  Valma CavaJessica L Koal Eslinger,MSN, CNM 10/20/2016, 8:32 PM

## 2016-10-20 NOTE — H&P (Addendum)
Andrea Bradley is a 26 y.o. female G1P0 at 7838 wks and 2 days based on LMP confirmed by 9 wk u/s with EDD 11/01/2016. Preganancy complicated by Preeclampsia without severe features diagnosed at her regular visit in the office today. BP in office 146/98 with 3+ protein. Pt denies headache no visual disturbances no ruq pain. +FM . No leakage of fluid no vaginal bleeding.  OB History    Gravida Para Term Preterm AB Living   1             SAB TAB Ectopic Multiple Live Births                 History reviewed. No pertinent past medical history. Past Surgical History:  Procedure Laterality Date  . MOLE REMOVAL     Family History: family history includes Diabetes in her maternal grandfather and maternal grandmother; Hypertension in her maternal grandmother. Social History:  reports that she has never smoked. She has never used smokeless tobacco. She reports that she drinks alcohol. Her drug history is not on file.     Maternal Diabetes: No Genetic Screening:declined Maternal Ultrasounds/Referrals: Normal Fetal Ultrasounds or other Referrals:  None Maternal Substance Abuse:  No Significant Maternal Medications:  None Significant Maternal Lab Results:  Lab values include: Group B Strep negative Other Comments:  None  Review of Systems  Constitutional: Negative.   HENT: Negative.   Eyes: Negative.   Respiratory: Negative.   Cardiovascular: Negative.   Gastrointestinal: Negative.   Genitourinary: Negative.   Musculoskeletal: Negative.   Skin: Negative.   Neurological: Negative.   Endo/Heme/Allergies: Negative.   Psychiatric/Behavioral: Negative.    Maternal Medical History:  Fetal activity: Perceived fetal activity is normal.    Prenatal complications: Pre-eclampsia.   Prenatal Complications - Diabetes: none.    Dilation: 1.5 Effacement (%): 70, 80 Station: -2 Exam by:: stone rnc Blood pressure (!) 148/105, pulse 75, temperature 98.4 F (36.9 C), temperature source Oral,  resp. rate 18, height 5\' 2"  (1.575 m), weight 83.9 kg (185 lb), last menstrual period 01/26/2016. Exam Physical Exam  Vitals reviewed. Constitutional: She is oriented to person, place, and time. She appears well-developed and well-nourished.  HENT:  Head: Normocephalic and atraumatic.  Eyes: Conjunctivae are normal. Pupils are equal, round, and reactive to light.  Neck: Normal range of motion.  Cardiovascular: Normal rate and regular rhythm.   Respiratory: Effort normal and breath sounds normal.  GI: There is no tenderness.  Genitourinary: Vagina normal.  Musculoskeletal: She exhibits edema.  Neurological: She is alert and oriented to person, place, and time. She displays abnormal reflex.  Skin: Skin is warm and dry.  Psychiatric: She has a normal mood and affect.    Prenatal labs: ABO, Rh: --/--/A POS (05/31 1810) Antibody: PENDING (05/31 1810) Rubella: Immune (11/09 0000) RPR: Nonreactive (11/08 0000)  HBsAg: Negative (11/08 0000)  HIV: Non-reactive (10/04 0000)  ZOX:[id number]:Negative     Assessment/Plan: 38 wks and 2 days with preeclampsia without severe features. Admitted for induction.  Cytotec for cervical ripening  CCOB covering after 7pm. Dr. Dion BodyVarnado to assume care at 7am in the morning.    Chrystopher Stangl J. 10/20/2016, 7:17 PM

## 2016-10-21 ENCOUNTER — Encounter (HOSPITAL_COMMUNITY): Payer: Self-pay | Admitting: Anesthesiology

## 2016-10-21 ENCOUNTER — Inpatient Hospital Stay (HOSPITAL_COMMUNITY): Payer: BC Managed Care – PPO | Admitting: Anesthesiology

## 2016-10-21 LAB — CBC
HCT: 31.6 % — ABNORMAL LOW (ref 36.0–46.0)
HCT: 33.7 % — ABNORMAL LOW (ref 36.0–46.0)
Hemoglobin: 11.2 g/dL — ABNORMAL LOW (ref 12.0–15.0)
Hemoglobin: 11.8 g/dL — ABNORMAL LOW (ref 12.0–15.0)
MCH: 31.9 pg (ref 26.0–34.0)
MCH: 31.9 pg (ref 26.0–34.0)
MCHC: 35.4 g/dL (ref 30.0–36.0)
MCHC: 35 g/dL (ref 30.0–36.0)
MCV: 90 fL (ref 78.0–100.0)
MCV: 91.1 fL (ref 78.0–100.0)
Platelets: 215 10*3/uL (ref 150–400)
Platelets: 209 10*3/uL (ref 150–400)
RBC: 3.51 MIL/uL — ABNORMAL LOW (ref 3.87–5.11)
RBC: 3.7 MIL/uL — ABNORMAL LOW (ref 3.87–5.11)
RDW: 14 % (ref 11.5–15.5)
RDW: 13.9 % (ref 11.5–15.5)
WBC: 8.2 10*3/uL (ref 4.0–10.5)
WBC: 10 10*3/uL (ref 4.0–10.5)

## 2016-10-21 LAB — COMPREHENSIVE METABOLIC PANEL
ALT: 14 U/L (ref 14–54)
AST: 24 U/L (ref 15–41)
Albumin: 2.6 g/dL — ABNORMAL LOW (ref 3.5–5.0)
Alkaline Phosphatase: 153 U/L — ABNORMAL HIGH (ref 38–126)
Anion gap: 7 (ref 5–15)
BUN: 10 mg/dL (ref 6–20)
CO2: 23 mmol/L (ref 22–32)
Calcium: 9.1 mg/dL (ref 8.9–10.3)
Chloride: 106 mmol/L (ref 101–111)
Creatinine, Ser: 0.66 mg/dL (ref 0.44–1.00)
GFR calc Af Amer: >60 mL/min (ref >60)
GFR calc non Af Amer: >60 mL/min (ref >60)
Glucose, Bld: 81 mg/dL (ref 65–99)
Potassium: 3.7 mmol/L (ref 3.5–5.1)
Sodium: 136 mmol/L (ref 135–145)
Total Bilirubin: 0.2 mg/dL — ABNORMAL LOW (ref 0.3–1.2)
Total Protein: 6.2 g/dL — ABNORMAL LOW (ref 6.5–8.1)

## 2016-10-21 LAB — MAGNESIUM: Magnesium: 5.3 mg/dL — ABNORMAL HIGH (ref 1.7–2.4)

## 2016-10-21 LAB — RPR: RPR Ser Ql: NONREACTIVE

## 2016-10-21 MED ORDER — COCONUT OIL OIL
1.0000 "application " | TOPICAL_OIL | Status: DC | PRN
  Administered 2016-10-22: 1 via TOPICAL
  Filled 2016-10-21: qty 120

## 2016-10-21 MED ORDER — TETANUS-DIPHTH-ACELL PERTUSSIS 5-2.5-18.5 LF-MCG/0.5 IM SUSP: 0.5000 mL | Freq: Once | INTRAMUSCULAR | Status: DC

## 2016-10-21 MED ORDER — FENTANYL 2.5 MCG/ML BUPIVACAINE 1/10 % EPIDURAL INFUSION (WH - ANES)
14.0000 mL/h | INTRAMUSCULAR | Status: DC | PRN
  Administered 2016-10-21: 12.5 mL/h via EPIDURAL
  Filled 2016-10-21: qty 100

## 2016-10-21 MED ORDER — ACETAMINOPHEN 325 MG PO TABS: 650.0000 mg | ORAL_TABLET | ORAL | Status: DC | PRN

## 2016-10-21 MED ORDER — EPHEDRINE 5 MG/ML INJ
10.0000 mg | INTRAVENOUS | Status: DC | PRN
  Filled 2016-10-21: qty 2

## 2016-10-21 MED ORDER — IBUPROFEN 600 MG PO TABS
600.0000 mg | ORAL_TABLET | Freq: Four times a day (QID) | ORAL | Status: DC
  Administered 2016-10-21 – 2016-10-23 (×7): 600 mg via ORAL
  Filled 2016-10-21 (×7): qty 1

## 2016-10-21 MED ORDER — LIDOCAINE HCL (PF) 1 % IJ SOLN
INTRAMUSCULAR | Status: DC | PRN
  Administered 2016-10-21 (×2): 4 mL via EPIDURAL

## 2016-10-21 MED ORDER — OXYTOCIN 40 UNITS IN LACTATED RINGERS INFUSION - SIMPLE MED: 2.5000 [IU]/h | INTRAVENOUS | Status: DC | PRN

## 2016-10-21 MED ORDER — LABETALOL HCL 5 MG/ML IV SOLN
20.0000 mg | INTRAVENOUS | Status: DC | PRN
Start: 2016-10-21 — End: 2016-10-22
  Administered 2016-10-21: 20 mg via INTRAVENOUS
  Administered 2016-10-21: 80 mg via INTRAVENOUS
  Filled 2016-10-21: qty 16
  Filled 2016-10-21: qty 8
  Filled 2016-10-21: qty 4

## 2016-10-21 MED ORDER — MAGNESIUM HYDROXIDE 400 MG/5ML PO SUSP: 30.0000 mL | ORAL | Status: DC | PRN

## 2016-10-21 MED ORDER — LABETALOL HCL 5 MG/ML IV SOLN
20.0000 mg | INTRAVENOUS | Status: DC | PRN
Start: 2016-10-21 — End: 2016-10-22
  Administered 2016-10-21: 40 mg via INTRAVENOUS

## 2016-10-21 MED ORDER — MAGNESIUM SULFATE 40 G IN LACTATED RINGERS - SIMPLE
2.0000 g/h | INTRAVENOUS | Status: DC
  Filled 2016-10-21: qty 500

## 2016-10-21 MED ORDER — SIMETHICONE 80 MG PO CHEW: 80.0000 mg | CHEWABLE_TABLET | ORAL | Status: DC | PRN

## 2016-10-21 MED ORDER — MAGNESIUM SULFATE BOLUS VIA INFUSION
4.0000 g | Freq: Once | INTRAVENOUS | Status: AC
  Administered 2016-10-21: 4 g via INTRAVENOUS
  Filled 2016-10-21: qty 500

## 2016-10-21 MED ORDER — FERROUS SULFATE 325 (65 FE) MG PO TABS
325.0000 mg | ORAL_TABLET | Freq: Two times a day (BID) | ORAL | Status: DC
  Administered 2016-10-22 – 2016-10-23 (×3): 325 mg via ORAL
  Filled 2016-10-21 (×3): qty 1

## 2016-10-21 MED ORDER — DIPHENHYDRAMINE HCL 25 MG PO CAPS: 25.0000 mg | ORAL_CAPSULE | Freq: Four times a day (QID) | ORAL | Status: DC | PRN

## 2016-10-21 MED ORDER — LACTATED RINGERS IV SOLN: 500.0000 mL | Freq: Once | INTRAVENOUS | Status: DC

## 2016-10-21 MED ORDER — SENNOSIDES-DOCUSATE SODIUM 8.6-50 MG PO TABS
2.0000 | ORAL_TABLET | ORAL | Status: DC
  Administered 2016-10-23: 2 via ORAL
  Filled 2016-10-21: qty 2

## 2016-10-21 MED ORDER — WITCH HAZEL-GLYCERIN EX PADS: 1.0000 "application " | MEDICATED_PAD | CUTANEOUS | Status: DC | PRN

## 2016-10-21 MED ORDER — BUTORPHANOL TARTRATE 1 MG/ML IJ SOLN: 1.0000 mg | INTRAMUSCULAR | Status: DC | PRN

## 2016-10-21 MED ORDER — ONDANSETRON HCL 4 MG PO TABS: 4.0000 mg | ORAL_TABLET | ORAL | Status: DC | PRN

## 2016-10-21 MED ORDER — PHENYLEPHRINE 40 MCG/ML (10ML) SYRINGE FOR IV PUSH (FOR BLOOD PRESSURE SUPPORT)
80.0000 ug | PREFILLED_SYRINGE | INTRAVENOUS | Status: DC | PRN
  Filled 2016-10-21: qty 5

## 2016-10-21 MED ORDER — DIPHENHYDRAMINE HCL 50 MG/ML IJ SOLN: 12.5000 mg | INTRAMUSCULAR | Status: DC | PRN

## 2016-10-21 MED ORDER — DIBUCAINE 1 % RE OINT: 1.0000 "application " | TOPICAL_OINTMENT | RECTAL | Status: DC | PRN

## 2016-10-21 MED ORDER — PRENATAL MULTIVITAMIN CH
1.0000 | ORAL_TABLET | Freq: Every day | ORAL | Status: DC
  Administered 2016-10-22 – 2016-10-23 (×2): 1 via ORAL
  Filled 2016-10-21 (×2): qty 1

## 2016-10-21 MED ORDER — ZOLPIDEM TARTRATE 5 MG PO TABS: 5.0000 mg | ORAL_TABLET | Freq: Every evening | ORAL | Status: DC | PRN

## 2016-10-21 MED ORDER — PHENYLEPHRINE 40 MCG/ML (10ML) SYRINGE FOR IV PUSH (FOR BLOOD PRESSURE SUPPORT)
80.0000 ug | PREFILLED_SYRINGE | INTRAVENOUS | Status: DC | PRN
  Filled 2016-10-21: qty 5
  Filled 2016-10-21: qty 10

## 2016-10-21 MED ORDER — HYDRALAZINE HCL 20 MG/ML IJ SOLN
5.0000 mg | INTRAMUSCULAR | Status: DC | PRN
Start: 2016-10-21 — End: 2016-10-22
  Administered 2016-10-21: 5 mg via INTRAVENOUS

## 2016-10-21 MED ORDER — BENZOCAINE-MENTHOL 20-0.5 % EX AERO: 1.0000 "application " | INHALATION_SPRAY | CUTANEOUS | Status: DC | PRN

## 2016-10-21 MED ORDER — ONDANSETRON HCL 4 MG/2ML IJ SOLN: 4.0000 mg | INTRAMUSCULAR | Status: DC | PRN

## 2016-10-21 NOTE — Anesthesia Procedure Notes (Addendum)
Epidural Patient location during procedure: OB Start time: 10/21/2016 2:50 PM  Staffing Anesthesiologist: Mal AmabileFOSTER, Aidynn Polendo Performed: anesthesiologist   Preanesthetic Checklist Completed: patient identified, site marked, surgical consent, pre-op evaluation, timeout performed, IV checked, risks and benefits discussed and monitors and equipment checked  Epidural Patient position: sitting Prep: site prepped and draped and DuraPrep Patient monitoring: continuous pulse ox and blood pressure Approach: midline Location: L3-L4 Injection technique: LOR air  Needle:  Needle type: Tuohy  Needle gauge: 17 G Needle length: 9 cm and 9 Needle insertion depth: 6 cm Catheter type: closed end flexible Catheter size: 19 Gauge Catheter at skin depth: 11 cm Test dose: negative and Other  Assessment Events: blood not aspirated, injection not painful, no injection resistance, negative IV test and no paresthesia  Additional Notes Patient identified. Risks and benefits discussed including failed block, incomplete  Pain control, post dural puncture headache, nerve damage, paralysis, blood pressure Changes, nausea, vomiting, reactions to medications-both toxic and allergic and post Partum back pain. All questions were answered. Patient expressed understanding and wished to proceed. Sterile technique was used throughout procedure. Epidural site was Dressed with sterile barrier dressing. No paresthesias, signs of intravascular injection Or signs of intrathecal spread were encountered.  Patient was more comfortable after the epidural was dosed. Please see RN's note for documentation of vital signs and FHR which are stable.

## 2016-10-21 NOTE — Anesthesia Preprocedure Evaluation (Signed)
Anesthesia Evaluation  Patient identified by MRN, date of birth, ID band Patient awake    Reviewed: Allergy & Precautions, Patient's Chart, lab work & pertinent test results  Airway Mallampati: III  TM Distance: >3 FB Neck ROM: Full    Dental no notable dental hx. (+) Teeth Intact   Pulmonary neg pulmonary ROS,    Pulmonary exam normal breath sounds clear to auscultation       Cardiovascular hypertension, Pt. on medications Normal cardiovascular exam Rhythm:Regular Rate:Normal     Neuro/Psych negative neurological ROS  negative psych ROS   GI/Hepatic Neg liver ROS, GERD  ,  Endo/Other  Obesity  Renal/GU negative Renal ROS  negative genitourinary   Musculoskeletal negative musculoskeletal ROS (+)   Abdominal (+) + obese,   Peds  Hematology  (+) anemia ,   Anesthesia Other Findings   Reproductive/Obstetrics (+) Pregnancy Pre eclampsia                             Anesthesia Physical Anesthesia Plan  ASA: III  Anesthesia Plan: Epidural   Post-op Pain Management:    Induction:   Airway Management Planned: Natural Airway  Additional Equipment:   Intra-op Plan:   Post-operative Plan:   Informed Consent: I have reviewed the patients History and Physical, chart, labs and discussed the procedure including the risks, benefits and alternatives for the proposed anesthesia with the patient or authorized representative who has indicated his/her understanding and acceptance.     Plan Discussed with: Anesthesiologist  Anesthesia Plan Comments:         Anesthesia Quick Evaluation

## 2016-10-21 NOTE — Progress Notes (Signed)
In to assess pt. Denies SOB, Dizziness, chest pain, headaches, visual changes, RUQ pain. Lower back pain only.  VS: 154/101 Gen:NAD Abd:  No RUQ pain Cervix 3/70/-2, intact membranes Cat I tracing, contraction q 1-3  Pit 17 mus UOP 300   Labs stable and normal  A/P Preeclampsia with severe features on Magnesium.  BP has improved.  No IV antihypertensives since my shift has started. Minimal change on Pitocin.  Consider AROM. No s/sxs of Magnesium toxicity.

## 2016-10-21 NOTE — Progress Notes (Signed)
Andrea Bradley is a 26 y.o. G1P0 at 4657w3d   Subjective: Pt is without complaints.  Denies headaches, SOB, RUQ pain.  +Fetal movement.  Objective: BP (!) 153/94   Pulse 71   Temp 98.3 F (36.8 C) (Oral)   Resp 17   Ht 5\' 2"  (1.575 m)   Wt 83.9 kg (185 lb)   LMP 01/26/2016 (Exact Date)   BMI 33.84 kg/m  No intake/output data recorded. No intake/output data recorded.  Gen:  NAD Abd:  No RUQ pain Neuro:  DTR 2+ FHT:  FHR: 140s bpm, variability: moderate,  accelerations:  Present,  decelerations:  Absent UC:   regular SVE:   Dilation: 3 Effacement (%): 80 Station: Ballotable Exam by:: Genelle BalE. Johnson, RN  Cervix deferred.  on Pitocin  Labs: Lab Results  Component Value Date   WBC 6.8 10/20/2016   HGB 11.9 (L) 10/20/2016   HCT 33.2 (L) 10/20/2016   MCV 90.5 10/20/2016   PLT 251 10/20/2016    Assessment / Plan: IUP @ 38 3/7 weeks Mild preeclampsia now severe by BP  Labor: Continue Pitocin. Preeclampsia:  labs stable and Start magnesium sulfate.  Repeat labs. Fetal Wellbeing:  Category I Pain Control:  not discussed I/D:  n/a Anticipated MOD:  NSVD  Katora Fini 10/21/2016, 7:38 AM

## 2016-10-21 NOTE — Lactation Note (Signed)
This note was copied from a baby's chart. Lactation Consultation Note  Patient Name: Andrea Bradley [id number]'s Date: 10/21/2016 Reason for consult: Initial assessment;Infant < 6lbs  Initial visit at 2 hours of age.  Baby is 318w3d and 5#5oz.  Baby was code apgar and mom is on mag.  Baby swaddled with MGM in double blankets.  LC discussed with mom feeding plan due weight  <6#.  Mom to supplement each feeding with EBM and post pump to establish a good milk supply. Mom has large full breasts with semi evert nipples left breast more compressible than right breast.  Colostrum easily expressed.   LC assisted with latching baby in "laid Back" hold baby latched well with wide gape and strong sucking.  Baby needs constant breast compression to maintain hold and slips off often.   Nursery Rn at bedside to assess baby, and LC repositioned mom for football latching. Baby's temp 96.5 so baby was taken with nursery RN and Peds Dr. Dorothea Ogleo nursery.  Lc encouraged mom to begin DEBP and continue hand expressing.  Mom to go to room 306 to continue Mag.   LC assisted with hand expression and collected 3mls LC took EBM to nursery to syringe feed baby and baby tolerated well.    [facility] given and discussed.  Encouraged to feed with early cues on demand.  Early newborn behavior discussed.  Mom will need additional follow up.   Hand expression demonstrated by mom with colostrum visible.  Mom to call for assist as needed.    Maternal Data Has patient been taught Hand Expression?: Yes Does the patient have breastfeeding experience prior to this delivery?: No  Feeding Feeding Type: Breast Fed Length of feed: 6 min  LATCH Score/Interventions Latch: Grasps breast easily, tongue down, lips flanged, rhythmical sucking.  Audible Swallowing: A few with stimulation  Type of Nipple: Everted at rest and after stimulation  Comfort (Breast/Nipple): Soft / non-tender     Hold (Positioning): Full assist, staff holds  infant at breast Intervention(s): Breastfeeding basics reviewed;Support Pillows;Position options;Skin to skin  LATCH Score: 7  Lactation Tools Discussed/Used     Consult Status Consult Status: Follow-up Date: 10/22/16 Follow-up type: In-patient    Andrea Bradley, Andrea Bradley 10/21/2016, 8:13 PM

## 2016-10-21 NOTE — Progress Notes (Signed)
Andrea Bradley MRN: [id number]  Subjective: -Patient reports perception of contraction but remains comfortable.  Reports fatigue. Family at bedside  Objective: BP (!) 159/90   Pulse 82   Temp 98.9 F (37.2 C) (Oral)   Resp 17   Ht 5\' 2"  (1.575 m)   Wt 83.9 kg (185 lb)   LMP 01/26/2016 (Exact Date)   BMI 33.84 kg/m  No intake/output data recorded. No intake/output data recorded.  Fetal Monitoring: FHT: 135 bpm, Mod Var, -Decels, +Accels UC: Q2-114min, palpates mild    Vaginal Exam: SVE:   Dilation: 1.5 Effacement (%): 70, 80 Station: -2 Exam by:: Andrea BalE. Johnson, RN  Membranes:Intact Internal Monitors: None  Augmentation/Induction: Pitocin:323mUn/min Cytotec: S/P One Dose  Assessment:  IUP at 38.3wks Cat I FT  PreEclampsia IOL-Pitocin  Plan: -Offered and declined sleep aide -Continue pitocin titration per orders -Continue other mgmt as ordered  Valma CavaJessica L Steffani Dionisio,MSN, CNM 10/21/2016, 2:43 AM

## 2016-10-22 LAB — CBC
HCT: 28.8 % — ABNORMAL LOW (ref 36.0–46.0)
Hemoglobin: 10.5 g/dL — ABNORMAL LOW (ref 12.0–15.0)
MCH: 32.5 pg (ref 26.0–34.0)
MCHC: 36.5 g/dL — ABNORMAL HIGH (ref 30.0–36.0)
MCV: 89.2 fL (ref 78.0–100.0)
Platelets: 198 10*3/uL (ref 150–400)
RBC: 3.23 MIL/uL — ABNORMAL LOW (ref 3.87–5.11)
RDW: 14.1 % (ref 11.5–15.5)
WBC: 11.4 10*3/uL — ABNORMAL HIGH (ref 4.0–10.5)

## 2016-10-22 LAB — COMPREHENSIVE METABOLIC PANEL
ALT: 14 U/L (ref 14–54)
AST: 34 U/L (ref 15–41)
Albumin: 2.2 g/dL — ABNORMAL LOW (ref 3.5–5.0)
Alkaline Phosphatase: 126 U/L (ref 38–126)
Anion gap: 4 — ABNORMAL LOW (ref 5–15)
BUN: 7 mg/dL (ref 6–20)
CO2: 27 mmol/L (ref 22–32)
Calcium: 7.6 mg/dL — ABNORMAL LOW (ref 8.9–10.3)
Chloride: 105 mmol/L (ref 101–111)
Creatinine, Ser: 0.84 mg/dL (ref 0.44–1.00)
GFR calc Af Amer: >60 mL/min (ref >60)
GFR calc non Af Amer: >60 mL/min (ref >60)
Glucose, Bld: 97 mg/dL (ref 65–99)
Potassium: 4.2 mmol/L (ref 3.5–5.1)
Sodium: 136 mmol/L (ref 135–145)
Total Bilirubin: 0.3 mg/dL (ref 0.3–1.2)
Total Protein: 4.9 g/dL — ABNORMAL LOW (ref 6.5–8.1)

## 2016-10-22 MED ORDER — MAGNESIUM SULFATE 40 G IN LACTATED RINGERS - SIMPLE
2.0000 g/h | INTRAVENOUS | Status: AC
  Administered 2016-10-22: 2 g/h via INTRAVENOUS
  Filled 2016-10-22: qty 40

## 2016-10-22 MED ORDER — MISOPROSTOL 200 MCG PO TABS
800.0000 ug | ORAL_TABLET | Freq: Once | ORAL | Status: DC | PRN
Start: 2016-10-22 — End: 2016-10-23

## 2016-10-22 MED ORDER — HYDRALAZINE HCL 20 MG/ML IJ SOLN
5.0000 mg | INTRAMUSCULAR | Status: DC | PRN
Start: 2016-10-22 — End: 2016-10-23

## 2016-10-22 MED ORDER — LABETALOL HCL 5 MG/ML IV SOLN: 20.0000 mg | INTRAVENOUS | Status: DC | PRN

## 2016-10-22 MED ORDER — LACTATED RINGERS IV SOLN
INTRAVENOUS | Status: DC
  Administered 2016-10-22: 08:00:00 via INTRAVENOUS

## 2016-10-22 NOTE — Lactation Note (Signed)
This note was copied from a baby's chart. Lactation Consultation Note  Patient Name: Andrea Bradley Date: 10/22/2016 Reason for consult: Follow-up assessment Baby at 16 hr of life. Met with [name] in the NICU. Mom stated pumping was "going". She seemed disappointed by the volume of milk she has been expressing. The RN at the bedside stated that mom was pumping "quite a bit". Encouraged her to keep up the pumping 8-12x/24hr even if sometimes she gets "drops". Reviewed supply/demand, nipple stimulation, breast changes, and nipple care. RN to get mom coconut oil to lubricate the flanges. Mom reports she has been using the #27 flanges because the #24 seemed too small. RN stated the #27 looked like a good fit. Mom is aware of lactation services and support group.   Maternal Data    Feeding    LATCH Score/Interventions                      Lactation Tools Discussed/Used     Consult Status Consult Status: Follow-up Date: 10/23/16 Follow-up type: In-patient    Denzil Hughes 10/22/2016, 10:37 AM

## 2016-10-22 NOTE — Anesthesia Postprocedure Evaluation (Signed)
Anesthesia Post Note  Patient: Andrea Bradley  Procedure(s) Performed: * No procedures listed *     Patient location during evaluation: Mother Baby Anesthesia Type: Epidural Level of consciousness: awake and alert, oriented and patient cooperative Pain management: pain level controlled Vital Signs Assessment: post-procedure vital signs reviewed and stable Respiratory status: spontaneous breathing Cardiovascular status: stable Postop Assessment: no headache, epidural receding, patient able to bend at knees and no signs of nausea or vomiting Anesthetic complications: no Comments: Pain score 0.    Last Vitals:  Vitals:   10/22/16 0608 10/22/16 0806  BP:  (!) 133/91  Pulse:  95  Resp: 19 18  Temp:  36.7 C    Last Pain:  Vitals:   10/22/16 0815  TempSrc:   PainSc: 0-No pain   Pain Goal:                 [facility] LLCWRINKLE,[name]

## 2016-10-22 NOTE — Progress Notes (Signed)
Post Partum Day 1 Subjective:  Well. Lochia are normal. Voiding, ambulating, tolerating normal diet. nursing going well.  Objective: Blood pressure 140/87, pulse 94, temperature 97.6 F (36.4 C), temperature source Oral, resp. rate 19, height 5\' 2"  (1.575 m), weight 185 lb (83.9 kg), last menstrual period 01/26/2016, SpO2 100 %, unknown if currently breastfeeding.  Physical Exam:  General: normal Lochia: appropriate Uterine Fundus: AU firm non-tender  Extremities: No evidence of DVT seen on physical exam. Edema 1+     Recent Labs  10/21/16 1755 10/22/16 0504  HGB 11.8* 10.5*  HCT 33.7* 28.8*    Assessment/Plan: Preclampsia Post-partum. Continue routine post-partum care. Anticipate discharge tomorrow   LOS: 2 days   Rhea PinkLori A Clemmons MD 10/22/2016, 7:27 AM

## 2016-10-22 NOTE — Progress Notes (Signed)
CSW acknowledges NICU admission.   Patient screened out for psychosocial assessment since none of the following apply:  Psychosocial stressors documented in mother or baby's chart  Gestation less than 32 weeks  Code at delivery   Infant with anomalies  Please contact the Clinical Social Worker if specific needs arise, or by MOB's request.  Lakeem Rozo, MSW, LCSW-A Clinical Social Worker  [facility]  Office: [phone]  

## 2016-10-23 MED ORDER — IBUPROFEN 600 MG PO TABS: 600.0000 mg | ORAL_TABLET | Freq: Four times a day (QID) | ORAL | 0 refills | Status: AC

## 2016-10-23 MED ORDER — LABETALOL HCL 100 MG PO TABS: 100.0000 mg | ORAL_TABLET | Freq: Two times a day (BID) | ORAL | 1 refills | Status: DC

## 2016-10-23 MED ORDER — FERROUS SULFATE 325 (65 FE) MG PO TABS: 325.0000 mg | ORAL_TABLET | Freq: Two times a day (BID) | ORAL | 3 refills | Status: AC

## 2016-10-23 NOTE — Discharge Instructions (Signed)
Home Care Instructions for Mom °ACTIVITY °· Gradually return to your regular activities. °· Let yourself rest. Nap while your baby sleeps. °· Avoid lifting anything that is heavier than 10 lb (4.5 kg) until your health care provider says it is okay. °· Avoid activities that take a lot of effort and energy (are strenuous) until approved by your health care provider. Walking at a slow-to-moderate pace is usually safe. °· If you had a cesarean delivery: °? Do not vacuum, climb stairs, or drive a car for 4-6 weeks. °? Have someone help you at home until you feel like you can do your usual activities yourself. °? Do exercises as told by your health care provider, if this applies. ° °VAGINAL BLEEDING °You may continue to bleed for 4-6 weeks after delivery. Over time, the amount of blood usually decreases and the color of the blood usually gets lighter. However, the flow of bright red blood may increase if you have been too active. If you need to use more than one pad in an hour because your pad gets soaked, or if you pass a large clot: °· Lie down. °· Raise your feet. °· Place a cold compress on your lower abdomen. °· Rest. °· Call your health care provider. ° °If you are breastfeeding, your period should return anytime between 8 weeks after delivery and the time that you stop breastfeeding. If you are not breastfeeding, your period should return 6-8 weeks after delivery. °PERINEAL CARE °The perineal area, or perineum, is the part of your body between your thighs. After delivery, this area needs special care. Follow these instructions as told by your health care provider. °· Take warm tub baths for 15-20 minutes. °· Use medicated pads and pain-relieving sprays and creams as told. °· Do not use tampons or douches until vaginal bleeding has stopped. °· Each time you go to the bathroom: °? Use a peri bottle. °? Change your pad. °? Use towelettes in place of toilet paper until your stitches have healed. °· Do Kegel exercises  every day. Kegel exercises help to maintain the muscles that support the vagina, bladder, and bowels. You can do these exercises while you are standing, sitting, or lying down. To do Kegel exercises: °? Tighten the muscles of your abdomen and the muscles that surround your birth canal. °? Hold for a few seconds. °? Relax. °? Repeat until you have done this 5 times in a row. °· To prevent hemorrhoids from developing or getting worse: °? Drink enough fluid to keep your urine clear or pale yellow. °? Avoid straining when having a bowel movement. °? Take over-the-counter medicines and stool softeners as told by your health care provider. ° °BREAST CARE °· Wear a tight-fitting bra. °· Avoid taking over-the-counter pain medicine for breast discomfort. °· Apply ice to the breasts to help with discomfort as needed: °? Put ice in a plastic bag. °? Place a towel between your skin and the bag. °? Leave the ice on for 20 minutes or as told by your health care provider. ° °NUTRITION °· Eat a well-balanced diet. °· Do not try to lose weight quickly by cutting back on calories. °· Take your prenatal vitamins until your postpartum checkup or until your health care provider tells you to stop. ° °POSTPARTUM DEPRESSION °You may find yourself crying for no apparent reason and unable to cope with all of the changes that come with having a newborn. This mood is called postpartum depression. Postpartum depression happens because your hormone   levels change after delivery. If you have postpartum depression, get support from your partner, friends, and family. If the depression does not go away on its own after several weeks, contact your health care provider. BREAST SELF-EXAM Do a breast self-exam each month, at the same time of the month. If you are breastfeeding, check your breasts just after a feeding, when your breasts are less full. If you are breastfeeding and your period has started, check your breasts on day 5, 6, or 7 of your  period. Report any lumps, bumps, or discharge to your health care provider. Know that breasts are normally lumpy if you are breastfeeding. This is temporary, and it is not a health risk. INTIMACY AND SEXUALITY Avoid sexual activity for at least 3-4 weeks after delivery or until the brownish-red vaginal flow is completely gone. If you want to avoid pregnancy, use some form of birth control. You can get pregnant after delivery, even if you have not had your period. SEEK MEDICAL CARE IF:  You feel unable to cope with the changes that a child brings to your life, and these feelings do not go away after several weeks.  You notice a lump, a bump, or discharge on your breast.  SEEK IMMEDIATE MEDICAL CARE IF:  Blood soaks your pad in 1 hour or less.  You have: ? Severe pain or cramping in your lower abdomen. ? A bad-smelling vaginal discharge. ? A fever that is not controlled by medicine. ? A fever, and an area of your breast is red and sore. ? Pain or redness in your calf. ? Sudden, severe chest pain. ? Shortness of breath. ? Painful or bloody urination. ? Problems with your vision.  You vomit for 12 hours or longer.  You develop a severe headache.  You have serious thoughts about hurting yourself, your child, or anyone else.  This information is not intended to replace advice given to you by your health care provider. Make sure you discuss any questions you have with your health care provider. Document Released: 05/06/2000 Document Revised: 10/15/2015 Document Reviewed: 11/10/2014 Elsevier Interactive Patient Education  2017 [employer] Postpartum Depression and Baby Blues The postpartum period begins right after the birth of a baby. During this time, there is often a great amount of joy and excitement. It is also a time of many changes in the life of the parents. Regardless of how many times a mother gives birth, each child brings new challenges and dynamics to the family. It is not  unusual to have feelings of excitement along with confusing shifts in moods, emotions, and thoughts. All mothers are at risk of developing postpartum depression or the "baby blues." These mood changes can occur right after giving birth, or they may occur many months after giving birth. The baby blues or postpartum depression can be mild or severe. Additionally, postpartum depression can go away rather quickly, or it can be a long-term condition. What are the causes? Raised hormone levels and the rapid drop in those levels are thought to be a main cause of postpartum depression and the baby blues. A number of hormones change during and after pregnancy. Estrogen and progesterone usually decrease right after the delivery of your baby. The levels of thyroid hormone and various cortisol steroids also rapidly drop. Other factors that play a role in these mood changes include major life events and genetics. What increases the risk? If you have any of the following risks for the baby blues or postpartum depression, know  what symptoms to watch out for during the postpartum period. Risk factors that may increase the likelihood of getting the baby blues or postpartum depression include:  Having a personal or family history of depression.  Having depression while being pregnant.  Having premenstrual mood issues or mood issues related to oral contraceptives.  Having a lot of life stress.  Having marital conflict.  Lacking a social support network.  Having a baby with special needs.  Having health problems, such as diabetes.  What are the signs or symptoms? Symptoms of baby blues include:  Brief changes in mood, such as going from extreme happiness to sadness.  Decreased concentration.  Difficulty sleeping.  Crying spells, tearfulness.  Irritability.  Anxiety.  Symptoms of postpartum depression typically begin within the first month after giving birth. These symptoms include:  Difficulty  sleeping or excessive sleepiness.  Marked weight loss.  Agitation.  Feelings of worthlessness.  Lack of interest in activity or food.  Postpartum psychosis is a very serious condition and can be dangerous. Fortunately, it is rare. Displaying any of the following symptoms is cause for immediate medical attention. Symptoms of postpartum psychosis include:  Hallucinations and delusions.  Bizarre or disorganized behavior.  Confusion or disorientation.  How is this diagnosed? A diagnosis is made by an evaluation of your symptoms. There are no medical or lab tests that lead to a diagnosis, but there are various questionnaires that a health care provider may use to identify those with the baby blues, postpartum depression, or psychosis. Often, a screening tool called the [place] Postnatal Depression Scale is used to diagnose depression in the postpartum period. How is this treated? The baby blues usually goes away on its own in 1-2 weeks. Social support is often all that is needed. You will be encouraged to get adequate sleep and rest. Occasionally, you may be given medicines to help you sleep. Postpartum depression requires treatment because it can last several months or longer if it is not treated. Treatment may include individual or group therapy, medicine, or both to address any social, physiological, and psychological factors that may play a role in the depression. Regular exercise, a healthy diet, rest, and social support may also be strongly recommended. Postpartum psychosis is more serious and needs treatment right away. Hospitalization is often needed. Follow these instructions at home:  Get as much rest as you can. Nap when the baby sleeps.  Exercise regularly. Some women find yoga and walking to be beneficial.  Eat a balanced and nourishing diet.  Do little things that you enjoy. Have a cup of tea, take a bubble bath, read your favorite magazine, or listen to your favorite  music.  Avoid alcohol.  Ask for help with household chores, cooking, grocery shopping, or running errands as needed. Do not try to do everything.  Talk to people close to you about how you are feeling. Get support from your partner, family members, friends, or other new moms.  Try to stay positive in how you think. Think about the things you are grateful for.  Do not spend a lot of time alone.  Only take over-the-counter or prescription medicine as directed by your health care provider.  Keep all your postpartum appointments.  Let your health care provider know if you have any concerns. Contact a health care provider if: You are having a reaction to or problems with your medicine. Get help right away if:  You have suicidal feelings.  You think you may harm the baby  or someone else. This information is not intended to replace advice given to you by your health care provider. Make sure you discuss any questions you have with your health care provider. Document Released: 02/11/2004 Document Revised: 10/15/2015 Document Reviewed: 02/18/2013 Elsevier Interactive Patient Education  2017 [employer] Inc. Hypertension During Pregnancy Hypertension, commonly called high blood pressure, is when the force of blood pumping through your arteries is too strong. Arteries are blood vessels that carry blood from the heart throughout the body. Hypertension during pregnancy can cause problems for you and your baby. Your baby may be born early (prematurely) or may not weigh as much as he or she should at birth. Very bad cases of hypertension during pregnancy can be life-threatening. Different types of hypertension can occur during pregnancy. These include:  Chronic hypertension. This happens when: ? You have hypertension before pregnancy and it continues during pregnancy. ? You develop hypertension before you are [redacted] weeks pregnant, and it continues during pregnancy.  Gestational hypertension. This is  hypertension that develops after the 20th week of pregnancy.  Preeclampsia, also called toxemia of pregnancy. This is a very serious type of hypertension that develops only during pregnancy. It affects the whole body, and it can be very dangerous for you and your baby.  Gestational hypertension and preeclampsia usually go away within 6 weeks after your baby is born. Women who have hypertension during pregnancy have a greater chance of developing hypertension later in life or during future pregnancies. What are the causes? The exact cause of hypertension is not known. What increases the risk? There are certain factors that make it more likely for you to develop hypertension during pregnancy. These include:  Having hypertension during a previous pregnancy or prior to pregnancy.  Being overweight.  Being older than age 99.  Being pregnant for the first time or being pregnant with more than one baby.  Becoming pregnant using fertilization methods such as IVF (in vitro fertilization).  Having diabetes, kidney problems, or systemic lupus erythematosus.  Having a family history of hypertension.  What are the signs or symptoms? Chronic hypertension and gestational hypertension rarely cause symptoms. Preeclampsia causes symptoms, which may include:  Increased protein in your urine. Your health care provider will check for this at every visit before you give birth (prenatal visit).  Severe headaches.  Sudden weight gain.  Swelling of the hands, face, legs, and feet.  Nausea and vomiting.  Vision problems, such as blurred or double vision.  Numbness in the face, arms, legs, and feet.  Dizziness.  Slurred speech.  Sensitivity to bright lights.  Abdominal pain.  Convulsions.  How is this diagnosed? You may be diagnosed with hypertension during a routine prenatal exam. At each prenatal visit, you may:  Have a urine test to check for high amounts of protein in your  urine.  Have your blood pressure checked. A blood pressure reading is recorded as two numbers, such as "120 over 80" (or 120/80). The first ("top") number is called the systolic pressure. It is a measure of the pressure in your arteries when your heart beats. The second ("bottom") number is called the diastolic pressure. It is a measure of the pressure in your arteries as your heart relaxes between beats. Blood pressure is measured in a unit called mm Hg. A normal blood pressure reading is: ? Systolic: below 120. ? Diastolic: below 80.  The type of hypertension that you are diagnosed with depends on your test results and when your symptoms developed.  Chronic hypertension is usually diagnosed before 20 weeks of pregnancy.  Gestational hypertension is usually diagnosed after 20 weeks of pregnancy.  Hypertension with high amounts of protein in the urine is diagnosed as preeclampsia.  Blood pressure measurements that stay above 160 systolic, or above 110 diastolic, are signs of severe preeclampsia.  How is this treated? Treatment for hypertension during pregnancy varies depending on the type of hypertension you have and how serious it is.  If you take medicines called ACE inhibitors to treat chronic hypertension, you may need to switch medicines. ACE inhibitors should not be taken during pregnancy.  If you have gestational hypertension, you may need to take blood pressure medicine.  If you are at risk for preeclampsia, your health care provider may recommend that you take a low-dose aspirin every day to prevent high blood pressure during your pregnancy.  If you have severe preeclampsia, you may need to be hospitalized so you and your baby can be monitored closely. You may also need to take medicine (magnesium sulfate) to prevent seizures and to lower blood pressure. This medicine may be given as an injection or through an IV tube.  In some cases, if your condition gets worse, you may need to  deliver your baby early.  Follow these instructions at home: Eating and drinking  Drink enough fluid to keep your urine clear or pale yellow.  Eat a healthy diet that is low in salt (sodium). Do not add salt to your food. Check food labels to see how much sodium a food or beverage contains. Lifestyle  Do not use any products that contain nicotine or tobacco, such as cigarettes and e-cigarettes. If you need help quitting, ask your health care provider.  Do not use alcohol.  Avoid caffeine.  Avoid stress as much as possible. Rest and get plenty of sleep. General instructions  Take over-the-counter and prescription medicines only as told by your health care provider.  While lying down, lie on your left side. This keeps pressure off your baby.  While sitting or lying down, raise (elevate) your feet. Try putting some pillows under your lower legs.  Exercise regularly. Ask your health care provider what kinds of exercise are best for you.  Keep all prenatal and follow-up visits as told by your health care provider. This is important. Contact a health care provider if:  You have symptoms that your health care provider told you may require more treatment or monitoring, such as: ? Fever. ? Vomiting. ? Headache. Get help right away if:  You have severe abdominal pain or vomiting that does not get better with treatment.  You suddenly develop swelling in your hands, ankles, or face.  You gain 4 lbs (1.8 kg) or more in 1 week.  You develop vaginal bleeding, or you have blood in your urine.  You do not feel your baby moving as much as usual.  You have blurred or double vision.  You have muscle twitching or sudden tightening (spasms).  You have shortness of breath.  Your lips or fingernails turn blue. This information is not intended to replace advice given to you by your health care provider. Make sure you discuss any questions you have with your health care provider. Document  Released: 01/25/2011 Document Revised: 11/27/2015 Document Reviewed: 10/23/2015 Elsevier Interactive Patient Education  [employer]2018 [employer] Inc.

## 2016-10-23 NOTE — Discharge Summary (Signed)
Obstetric Discharge Summary Reason for Admission: induction of labor Preeclampsia Prenatal Procedures: Preeclampsia Intrapartum Procedures: spontaneous vaginal delivery Postpartum Procedures: magnesium sulfate Complications-Operative and Postpartum:   Delivery Note At 5:44 PM a viable female was delivered via Vaginal, Spontaneous Delivery (Presentation: Direct OA ).  APGAR: , 9; weight pending.   Placenta status: intact, spontaneous.  Sent to pathology Cord:  with the following complications: Tight nuchal cord, surgically reduced .  Cord pH: pending, art/ven  Anesthesia:  Epidural Episiotomy: None Lacerations:   Suture Repair: 3.0 vicryl Est. Blood Loss (mL): 200 UOP 900 since 7 am.  Code APGAR called.  Baby floppy, pale. Had some fetal movement.  Pt appeared somnolent.  Stat Magnesium ordered.  Mom to postpartum.  Baby to Couplet care / Skin to Skin.  Parents desire circumcision.  Baby will be on father's insurance.  Andrea Bradley 10/21/2016, 6:22 PM  Hospital Course:  Active Problems:   Preeclampsia, third trimester   Andrea Bradley is a 26 y.o. G1P1001 s/p SVD.  Patient was admitted 10/20/16.  She has postpartum course that was complicated by Preeclampsia. The pt feels ready to go home and  will be discharged with outpatient follow-up.   Today: No acute events overnight.  Pt denies problems with ambulating, voiding or po intake.  She denies nausea or vomiting.  Pain is well controlled.  She  flatus.  Plan for birth control is undecided.  Method of FeedingBreast Physical Exam:  BP (!) 154/92 (BP Location: Right Arm)   Pulse 91   Temp 98.4 F (36.9 C) (Oral)   Resp 18   Ht 5\' 2"  (1.575 m)   Wt 185 lb (83.9 kg)   LMP 01/26/2016 (Exact Date)   SpO2 100%   Breastfeeding? Unknown   BMI 33.84 kg/m  General: alert, cooperative and appears stated age 42: appropriate Uterine Fundus: firm Incision:  DVT Evaluation: No evidence of DVT seen on physical  exam.  H/H: Lab Results  Component Value Date/Time   HGB 10.5 (L) 10/22/2016 05:04 AM   HCT 28.8 (L) 10/22/2016 05:04 AM    Discharge Diagnoses: Term Pregnancy-delivered and Preelampsia  Discharge Information: Date: 10/23/2016 Activity: pelvic rest Diet: routine  Medications: PNV, Ibuprofen and Iron Breast feeding:  Yes Condition: stable Instructions: refer to practice specific booklet and refer to handout Discharge to: home   Discharge Instructions    Activity as tolerated    Complete by:  As directed    Call MD for:    Complete by:  As directed    Call MD for:  difficulty breathing, headache or visual disturbances    Complete by:  As directed    Call MD for:  extreme fatigue    Complete by:  As directed    Call MD for:  hives    Complete by:  As directed    Call MD for:  persistant dizziness or light-headedness    Complete by:  As directed    Call MD for:  persistant nausea and vomiting    Complete by:  As directed    Call MD for:  severe uncontrolled pain    Complete by:  As directed    Call MD for:  temperature >100.4    Complete by:  As directed    Diet - low sodium heart healthy    Complete by:  As directed    Discharge instructions    Complete by:  As directed    Call and make appointment for blood pressure this  week   Lifting restrictions    Complete by:  As directed    Weight restriction of 10 lbs.   Sexual acrtivity    Complete by:  As directed    6 weeks     Allergies as of 10/23/2016   No Known Allergies     Medication List    STOP taking these medications   valACYclovir 1000 MG tablet Commonly known as:  VALTREX     TAKE these medications   acetaminophen 500 MG tablet Commonly known as:  TYLENOL Take 1,000 mg by mouth every 6 (six) hours as needed for moderate pain.   ferrous sulfate 325 (65 FE) MG tablet Take 1 tablet (325 mg total) by mouth 2 (two) times daily with a meal.   ibuprofen 600 MG tablet Commonly known as:   ADVIL,MOTRIN Take 1 tablet (600 mg total) by mouth every 6 (six) hours.   labetalol 100 MG tablet Commonly known as:  NORMODYNE Take 1 tablet (100 mg total) by mouth 2 (two) times daily.   prenatal multivitamin Tabs tablet Take 1 tablet by mouth daily at 12 noon.      Follow-up Information    [facility], [facility] Schedule an appointment as soon as possible for a visit in 1 week(s).   Specialty:  Obstetrics and Gynecology Why:  Call and make appointment for blood pressure check Contact information: [address] [place] [id number] [phone]            Rhea PinkLori A Clemmons, CNM 10/23/2016,12:14 PM

## 2016-10-25 ENCOUNTER — Encounter (HOSPITAL_COMMUNITY): Payer: Self-pay

## 2016-10-25 ENCOUNTER — Inpatient Hospital Stay (HOSPITAL_COMMUNITY): Payer: BC Managed Care – PPO

## 2016-10-25 ENCOUNTER — Inpatient Hospital Stay (HOSPITAL_COMMUNITY)
Admission: AD | Admit: 2016-10-25 | Discharge: 2016-10-26 | DRG: 776 | Disposition: A | Discharge status: Home / Self Care | Payer: BC Managed Care – PPO | Source: Ambulatory Visit | Attending: Obstetrics and Gynecology | Admitting: Obstetrics and Gynecology

## 2016-10-25 DIAGNOSIS — O1495 Unspecified pre-eclampsia, complicating the puerperium: Secondary | ICD-10-CM

## 2016-10-25 DIAGNOSIS — O1410 Severe pre-eclampsia, unspecified trimester: Secondary | ICD-10-CM

## 2016-10-25 HISTORY — DX: Unspecified pre-eclampsia, complicating the puerperium: O14.95

## 2016-10-25 HISTORY — DX: Gestational (pregnancy-induced) hypertension without significant proteinuria, unspecified trimester: O13.9

## 2016-10-25 LAB — COMPREHENSIVE METABOLIC PANEL
ALT: 78 U/L — ABNORMAL HIGH (ref 14–54)
AST: 121 U/L — ABNORMAL HIGH (ref 15–41)
Albumin: 2.7 g/dL — ABNORMAL LOW (ref 3.5–5.0)
Alkaline Phosphatase: 116 U/L (ref 38–126)
Anion gap: 7 (ref 5–15)
BUN: 8 mg/dL (ref 6–20)
CO2: 26 mmol/L (ref 22–32)
Calcium: 8.8 mg/dL — ABNORMAL LOW (ref 8.9–10.3)
Chloride: 104 mmol/L (ref 101–111)
Creatinine, Ser: 0.84 mg/dL (ref 0.44–1.00)
GFR calc Af Amer: >60 mL/min (ref >60)
GFR calc non Af Amer: >60 mL/min (ref >60)
Glucose, Bld: 93 mg/dL (ref 65–99)
Potassium: 4 mmol/L (ref 3.5–5.1)
Sodium: 137 mmol/L (ref 135–145)
Total Bilirubin: 0.5 mg/dL (ref 0.3–1.2)
Total Protein: 6.3 g/dL — ABNORMAL LOW (ref 6.5–8.1)

## 2016-10-25 LAB — CBC
HCT: 31.6 % — ABNORMAL LOW (ref 36.0–46.0)
Hemoglobin: 11 g/dL — ABNORMAL LOW (ref 12.0–15.0)
MCH: 32.1 pg (ref 26.0–34.0)
MCHC: 34.8 g/dL (ref 30.0–36.0)
MCV: 92.1 fL (ref 78.0–100.0)
Platelets: 233 10*3/uL (ref 150–400)
RBC: 3.43 MIL/uL — ABNORMAL LOW (ref 3.87–5.11)
RDW: 14.4 % (ref 11.5–15.5)
WBC: 8.8 10*3/uL (ref 4.0–10.5)

## 2016-10-25 LAB — PROTEIN / CREATININE RATIO, URINE
Creatinine, Urine: 29 mg/dL
Protein Creatinine Ratio: 0.55 mg/mg{Cre} — ABNORMAL HIGH (ref 0.00–0.15)
Total Protein, Urine: 16 mg/dL

## 2016-10-25 MED ORDER — LABETALOL HCL 5 MG/ML IV SOLN
20.0000 mg | INTRAVENOUS | Status: AC | PRN
  Administered 2016-10-25: 40 mg via INTRAVENOUS
  Administered 2016-10-25: 80 mg via INTRAVENOUS
  Administered 2016-10-25: 20 mg via INTRAVENOUS
  Filled 2016-10-25: qty 16
  Filled 2016-10-25: qty 4
  Filled 2016-10-25: qty 8

## 2016-10-25 MED ORDER — HYDRALAZINE HCL 20 MG/ML IJ SOLN
10.0000 mg | Freq: Once | INTRAMUSCULAR | Status: AC | PRN
  Administered 2016-10-25: 10 mg via INTRAVENOUS
  Filled 2016-10-25: qty 1

## 2016-10-25 MED ORDER — LABETALOL HCL 5 MG/ML IV SOLN: 20.0000 mg | INTRAVENOUS | Status: DC | PRN

## 2016-10-25 MED ORDER — HYDRALAZINE HCL 20 MG/ML IJ SOLN
10.0000 mg | Freq: Once | INTRAMUSCULAR | Status: DC | PRN
Start: 2016-10-25 — End: 2016-10-25

## 2016-10-25 MED ORDER — LABETALOL HCL 200 MG PO TABS
200.0000 mg | ORAL_TABLET | Freq: Two times a day (BID) | ORAL | Status: DC
  Administered 2016-10-25 (×2): 200 mg via ORAL
  Filled 2016-10-25 (×2): qty 1

## 2016-10-25 MED ORDER — MAGNESIUM SULFATE BOLUS VIA INFUSION
4.0000 g | Freq: Once | INTRAVENOUS | Status: AC
  Administered 2016-10-25: 4 g via INTRAVENOUS
  Filled 2016-10-25: qty 500

## 2016-10-25 MED ORDER — LACTATED RINGERS IV SOLN
INTRAVENOUS | Status: DC
  Administered 2016-10-25 – 2016-10-26 (×2): via INTRAVENOUS

## 2016-10-25 MED ORDER — IBUPROFEN 800 MG PO TABS
800.0000 mg | ORAL_TABLET | Freq: Three times a day (TID) | ORAL | Status: DC | PRN
  Administered 2016-10-25 – 2016-10-26 (×2): 800 mg via ORAL
  Filled 2016-10-25 (×2): qty 1

## 2016-10-25 MED ORDER — MAGNESIUM SULFATE 40 G IN LACTATED RINGERS - SIMPLE
2.0000 g/h | INTRAVENOUS | Status: AC
  Administered 2016-10-25 – 2016-10-26 (×2): 2 g/h via INTRAVENOUS
  Filled 2016-10-25 (×2): qty 40

## 2016-10-25 NOTE — MAU Provider Note (Signed)
History     CSN: [id number]  Arrival date and time: 10/25/16 [id number]  Provider in to see patient: 0920 - informed by RN that patient was in radiology Provider initial interaction with patient: [phone]     Chief Complaint  Patient presents with  . Hypertension   HPI  Andrea Bradley is a 21 female who is 4 days postpartum with h/o PEC with severe features sent [facility].  Her complaint to her OB office was "SOB and difficulty catching breath." She has dependent edema that she had at d/c on 10/23/16.  She denies H/A, blurry vision or RUQ pain.  Denies abdominal pain, heavy VB, N/V. She is anticipating her baby, Andrea Bradley, to be d/c'd from NICU today and is looking forward to that. Very tearful at the possibility of readmission.  Past Medical History:  Diagnosis Date  . Pregnancy induced hypertension     Past Surgical History:  Procedure Laterality Date  . MOLE REMOVAL    . NO PAST SURGERIES      Family History  Problem Relation Age of Onset  . Hypertension Maternal Grandmother   . Diabetes Maternal Grandmother   . Diabetes Maternal Grandfather     Social History  Substance Use Topics  . Smoking status: Never Smoker  . Smokeless tobacco: Never Used  . Alcohol use Yes     Comment: SOCIAL    Allergies: No Known Allergies  Prescriptions Prior to Admission  Medication Sig Dispense Refill Last Dose  . ferrous sulfate 325 (65 FE) MG tablet Take 1 tablet (325 mg total) by mouth 2 (two) times daily with a meal. 60 tablet 3 10/25/2016 at Unknown time  . ibuprofen (ADVIL,MOTRIN) 600 MG tablet Take 1 tablet (600 mg total) by mouth every 6 (six) hours. 30 tablet 0 10/25/2016 at Unknown time  . labetalol (NORMODYNE) 100 MG tablet Take 1 tablet (100 mg total) by mouth 2 (two) times daily. 60 tablet 1 10/25/2016 at 0830  . Prenatal Vit-Fe Fumarate-FA (PRENATAL MULTIVITAMIN) TABS tablet Take 1 tablet by mouth daily at 12 noon.   10/25/2016 at Unknown time    Review  of Systems  Constitutional: Negative.   HENT: Negative.   Eyes: Negative.   Respiratory: Positive for shortness of breath.   Cardiovascular: Positive for leg swelling.  Gastrointestinal: Negative.   Endocrine: Negative.   Genitourinary: Negative.   Musculoskeletal: Negative.   Skin: Negative.   Allergic/Immunologic: Negative.   Neurological: Negative.   Hematological: Negative.   Psychiatric/Behavioral: Negative.    Physical Exam   Blood pressure (!) 173/94, pulse 76, temperature 98.5 F (36.9 C), temperature source Oral, resp. rate 20, height 5\' 2"  (1.575 m), weight 82.6 kg (182 lb), SpO2 98 %.  Patient Vitals for the past 24 hrs:  BP Temp Temp src Pulse Resp SpO2 Height Weight  10/25/16 1046 (!) 170/107 - - 81 - 99 % - -  10/25/16 1041 - - - - - 98 % - -  10/25/16 1036 - - - - - 99 % - -  10/25/16 1029 (!) 183/113 - - 83 - - - -  10/25/16 1027 - - - - - 98 % - -  10/25/16 1023 - - - - - 97 % - -  10/25/16 1020 (!) 177/115 - - 79 - - - -  10/25/16 1017 - - - - - 98 % - -  10/25/16 1013 - - - - - 98 % - -  10/25/16  1010 (!) 183/111 - - 75 - - - -  10/25/16 1008 - - - - - 99 % - -  10/25/16 1003 - - - - - 98 % - -  10/25/16 1000 (!) 173/94 - - 76 - - - -  10/25/16 0958 - - - - - 98 % - -  10/25/16 0945 (!) 187/112 - - 73 - 100 % - -  10/25/16 0941 (!) 177/106 - - 80 - - - -  10/25/16 0940 - - - - - 97 % - -  10/25/16 0916 - - - - - 98 % - -  10/25/16 0915 (!) 184/109 98.5 F (36.9 C) Oral 80 20 100 % - -  10/25/16 0911 - - - - - - 5\' 2"  (1.575 m) 82.6 kg (182 lb)    Physical Exam  Constitutional: She is oriented to person, place, and time. She appears well-developed and well-nourished.  HENT:  Head: Normocephalic.  Eyes: Pupils are equal, round, and reactive to light.  Neck: Normal range of motion.  Cardiovascular: Normal rate, regular rhythm, normal heart sounds and intact distal pulses.   Respiratory: Effort normal and breath sounds normal.  GI: Soft. Bowel  sounds are normal.  Genitourinary:  Genitourinary Comments: Pelvic deferred  Musculoskeletal: Normal range of motion.  Neurological: She is alert and oriented to person, place, and time. She has normal reflexes.  Skin: Skin is warm and dry.  Psychiatric: She has a normal mood and affect. Her behavior is normal. Judgment and thought content normal.   Results for orders placed or performed during the hospital encounter of 10/25/16 (from the past 24 hour(s))  Protein / creatinine ratio, urine     Status: Abnormal   Collection Time: 10/25/16  9:05 AM  Result Value Ref Range   Creatinine, Urine 29.00 mg/dL   Total Protein, Urine 16 mg/dL   Protein Creatinine Ratio 0.55 (H) 0.00 - 0.15 mg/mg[Cre]  CBC     Status: Abnormal   Collection Time: 10/25/16  9:17 AM  Result Value Ref Range   WBC 8.8 4.0 - 10.5 K/uL   RBC 3.43 (L) 3.87 - 5.11 MIL/uL   Hemoglobin 11.0 (L) 12.0 - 15.0 g/dL   HCT 81.1 (L) 91.4 - 78.2 %   MCV 92.1 78.0 - 100.0 fL   MCH 32.1 26.0 - 34.0 pg   MCHC 34.8 30.0 - 36.0 g/dL   RDW 95.6 21.3 - 08.6 %   Platelets 233 150 - 400 K/uL  Comprehensive metabolic panel     Status: Abnormal   Collection Time: 10/25/16  9:17 AM  Result Value Ref Range   Sodium 137 135 - 145 mmol/L   Potassium 4.0 3.5 - 5.1 mmol/L   Chloride 104 101 - 111 mmol/L   CO2 26 22 - 32 mmol/L   Glucose, Bld 93 65 - 99 mg/dL   BUN 8 6 - 20 mg/dL   Creatinine, Ser 5.78 0.44 - 1.00 mg/dL   Calcium 8.8 (L) 8.9 - 10.3 mg/dL   Total Protein 6.3 (L) 6.5 - 8.1 g/dL   Albumin 2.7 (L) 3.5 - 5.0 g/dL   AST 469 (H) 15 - 41 U/L   ALT 78 (H) 14 - 54 U/L   Alkaline Phosphatase 116 38 - 126 U/L   Total Bilirubin 0.5 0.3 - 1.2 mg/dL   GFR calc non Af Amer >60 >60 mL/min   GFR calc Af Amer >60 >60 mL/min   Anion gap 7 5 -  15   Dg Chest 2 View  Result Date: 10/25/2016 CLINICAL DATA:  Shortness of breath. EXAM: CHEST  2 VIEW COMPARISON:  None. FINDINGS: The heart size is mildly enlarged. The mediastinal contours are  within normal limits. Left upper lobe perihilar opacity measures 2.3 cm. Additionally, there is the opacification within the lingula. The visualized skeletal structures are unremarkable. IMPRESSION: 1. Airspace opacities within the left upper lobe and lingula worrisome for pneumonia. Electronically Signed   By: Signa Kell M.D.   On: 10/25/2016 09:46   MAU Course  Procedures  MDM CBC CMP P/C ratio CXR 2 view  Consult with Dr. Richardson Dopp -- admit to HR unit, Magnesium 4 gm bolus then 2 gm/hr, PIH labs in 12 hrs, NPO, Strict I&O, daily weights  Assessment and Plan  Preeclampsia in postpartum period - Admit to HR unit - Preeclampsia Postpartum Admission order (as ordered by Dr. Richardson Dopp)   Raelyn Mora, MSN, CNM 10/25/2016, 10:02 AM

## 2016-10-25 NOTE — H&P (Signed)
History     CSN: [id number]  Arrival date and time: 10/25/16 [id number]  Provider in to see patient: 0920 - informed by RN that patient was in radiology Provider initial interaction with patient: [phone]     Chief Complaint  Patient presents with  . Hypertension   HPI  Andrea Bradley is a 89 female who is 4 days postpartum with h/o PEC with severe features [facility].  Her complaint to her OB office was "SOB and difficulty catching breath." She has dependent edema that she had at d/c on 10/23/16.  She denies H/A, blurry vision or RUQ pain.  Denies abdominal pain, heavy VB, N/V. She is anticipating her baby, [name], to be d/c'd from NICU today and is looking forward to that. Very tearful at the possibility of readmission.  Past Medical History:  Diagnosis Date  . Pregnancy induced hypertension     Past Surgical History:  Procedure Laterality Date  . MOLE REMOVAL    . NO PAST SURGERIES      Family History  Problem Relation Age of Onset  . Hypertension Maternal Grandmother   . Diabetes Maternal Grandmother   . Diabetes Maternal Grandfather     Social History  Substance Use Topics  . Smoking status: Never Smoker  . Smokeless tobacco: Never Used  . Alcohol use Yes     Comment: SOCIAL    Allergies: No Known Allergies  Prescriptions Prior to Admission  Medication Sig Dispense Refill Last Dose  . ferrous sulfate 325 (65 FE) MG tablet Take 1 tablet (325 mg total) by mouth 2 (two) times daily with a meal. 60 tablet 3 10/25/2016 at Unknown time  . ibuprofen (ADVIL,MOTRIN) 600 MG tablet Take 1 tablet (600 mg total) by mouth every 6 (six) hours. 30 tablet 0 10/25/2016 at Unknown time  . labetalol (NORMODYNE) 100 MG tablet Take 1 tablet (100 mg total) by mouth 2 (two) times daily. 60 tablet 1 10/25/2016 at 0830  . Prenatal Vit-Fe Fumarate-FA (PRENATAL MULTIVITAMIN) TABS tablet Take 1 tablet by mouth daily at 12 noon.   10/25/2016 at Unknown time     Review of Systems  Constitutional: Negative.   HENT: Negative.   Eyes: Negative.   Respiratory: Positive for shortness of breath.   Cardiovascular: Positive for leg swelling.  Gastrointestinal: Negative.   Endocrine: Negative.   Genitourinary: Negative.   Musculoskeletal: Negative.   Skin: Negative.   Allergic/Immunologic: Negative.   Neurological: Negative.   Hematological: Negative.   Psychiatric/Behavioral: Negative.    Physical Exam   Blood pressure (!) 173/94, pulse 76, temperature 98.5 F (36.9 C), temperature source Oral, resp. rate 20, height 5\' 2"  (1.575 m), weight 82.6 kg (182 lb), SpO2 98 %.  Patient Vitals for the past 24 hrs:  BP Temp Temp src Pulse Resp SpO2 Height Weight  10/25/16 1046 (!) 170/107 - - 81 - 99 % - -  10/25/16 1041 - - - - - 98 % - -  10/25/16 1036 - - - - - 99 % - -  10/25/16 1029 (!) 183/113 - - 83 - - - -  10/25/16 1027 - - - - - 98 % - -  10/25/16 1023 - - - - - 97 % - -  10/25/16 1020 (!) 177/115 - - 79 - - - -  10/25/16 1017 - - - - - 98 % - -  10/25/16 1013 - - - - - 98 % - -  10/25/16 1010 (!) 183/111 - - 75 - - - -  10/25/16 1008 - - - - - 99 % - -  10/25/16 1003 - - - - - 98 % - -  10/25/16 1000 (!) 173/94 - - 76 - - - -  10/25/16 0958 - - - - - 98 % - -  10/25/16 0945 (!) 187/112 - - 73 - 100 % - -  10/25/16 0941 (!) 177/106 - - 80 - - - -  10/25/16 0940 - - - - - 97 % - -  10/25/16 0916 - - - - - 98 % - -  10/25/16 0915 (!) 184/109 98.5 F (36.9 C) Oral 80 20 100 % - -  10/25/16 0911 - - - - - - 5\' 2"  (1.575 m) 82.6 kg (182 lb)    Physical Exam  Constitutional: She is oriented to person, place, and time. She appears well-developed and well-nourished.  HENT:  Head: Normocephalic.  Eyes: Pupils are equal, round, and reactive to light.  Neck: Normal range of motion.  Cardiovascular: Normal rate, regular rhythm, normal heart sounds and intact distal pulses.   Respiratory: Effort normal and breath sounds normal.  GI:  Soft. Bowel sounds are normal.  Genitourinary:  Genitourinary Comments: Pelvic deferred  Musculoskeletal: Normal range of motion.  Neurological: She is alert and oriented to person, place, and time. She has normal reflexes.  Skin: Skin is warm and dry.  Psychiatric: She has a normal mood and affect. Her behavior is normal. Judgment and thought content normal.   Results for orders placed or performed during the hospital encounter of 10/25/16 (from the past 24 hour(s))  Protein / creatinine ratio, urine     Status: Abnormal   Collection Time: 10/25/16  9:05 AM  Result Value Ref Range   Creatinine, Urine 29.00 mg/dL   Total Protein, Urine 16 mg/dL   Protein Creatinine Ratio 0.55 (H) 0.00 - 0.15 mg/mg[Cre]  CBC     Status: Abnormal   Collection Time: 10/25/16  9:17 AM  Result Value Ref Range   WBC 8.8 4.0 - 10.5 K/uL   RBC 3.43 (L) 3.87 - 5.11 MIL/uL   Hemoglobin 11.0 (L) 12.0 - 15.0 g/dL   HCT 16.1 (L) 09.6 - 04.5 %   MCV 92.1 78.0 - 100.0 fL   MCH 32.1 26.0 - 34.0 pg   MCHC 34.8 30.0 - 36.0 g/dL   RDW 40.9 81.1 - 91.4 %   Platelets 233 150 - 400 K/uL  Comprehensive metabolic panel     Status: Abnormal   Collection Time: 10/25/16  9:17 AM  Result Value Ref Range   Sodium 137 135 - 145 mmol/L   Potassium 4.0 3.5 - 5.1 mmol/L   Chloride 104 101 - 111 mmol/L   CO2 26 22 - 32 mmol/L   Glucose, Bld 93 65 - 99 mg/dL   BUN 8 6 - 20 mg/dL   Creatinine, Ser 7.82 0.44 - 1.00 mg/dL   Calcium 8.8 (L) 8.9 - 10.3 mg/dL   Total Protein 6.3 (L) 6.5 - 8.1 g/dL   Albumin 2.7 (L) 3.5 - 5.0 g/dL   AST 956 (H) 15 - 41 U/L   ALT 78 (H) 14 - 54 U/L   Alkaline Phosphatase 116 38 - 126 U/L   Total Bilirubin 0.5 0.3 - 1.2 mg/dL   GFR calc non Af Amer >60 >60 mL/min   GFR calc Af Amer >60 >60 mL/min   Anion gap 7 5 -  15   Dg Chest 2 View  Result Date: 10/25/2016 CLINICAL DATA:  Shortness of breath. EXAM: CHEST  2 VIEW COMPARISON:  None. FINDINGS: The heart size is mildly enlarged. The mediastinal  contours are within normal limits. Left upper lobe perihilar opacity measures 2.3 cm. Additionally, there is the opacification within the lingula. The visualized skeletal structures are unremarkable. IMPRESSION: 1. Airspace opacities within the left upper lobe and lingula worrisome for pneumonia. Electronically Signed   By: Signa Kell M.D.   On: 10/25/2016 09:46   MAU Course  Procedures  MDM CBC CMP P/C ratio CXR 2 view  Consult with Dr. Richardson Dopp -- admit to HR unit, Magnesium 4 gm bolus then 2 gm/hr, PIH labs in 12 hrs, NPO, Strict I&O, daily weights  Assessment and Plan  Preeclampsia in postpartum period - Admit to HR unit - Preeclampsia Postpartum Admission order (as ordered by Dr. Richardson Dopp)  Patient seen and examined. Admitted due to  postpartum preeclampsia .Marland Kitchen Plan to continue magnesium for 48 hours. Will start labetalol 200mg  bid for hypertension. Repeat PIH labs in AM.  CCOB covering after 7pm this evening.

## 2016-10-25 NOTE — MAU Note (Signed)
Pt sent from Dr Dawayne Patriciaole's office for PP Pre-E eval.

## 2016-10-25 NOTE — Lactation Note (Signed)
Lactation Consultation Note  Patient Name: Andrea Bradley ZOXWR'UToday's Date: 10/25/2016   Mom readmitted for HBP and MgSO4. Mom reports infant has been latching and she plans to continue to latch him. Her breasts are filling, they are not engorged. Mom declines need for pump at this time. She is going to let her nurse know if she needs a pump at a later time. She declined need for LC at this time.      Maternal Data    Feeding    LATCH Score/Interventions                      Lactation Tools Discussed/Used     Consult Status      Ed BlalockSharon S Carry Weesner 10/25/2016, 3:28 PM

## 2016-10-26 LAB — COMPREHENSIVE METABOLIC PANEL
ALT: 63 U/L — ABNORMAL HIGH (ref 14–54)
AST: 65 U/L — ABNORMAL HIGH (ref 15–41)
Albumin: 2.9 g/dL — ABNORMAL LOW (ref 3.5–5.0)
Alkaline Phosphatase: 124 U/L (ref 38–126)
Anion gap: 8 (ref 5–15)
BUN: 6 mg/dL (ref 6–20)
CO2: 28 mmol/L (ref 22–32)
Calcium: 8 mg/dL — ABNORMAL LOW (ref 8.9–10.3)
Chloride: 103 mmol/L (ref 101–111)
Creatinine, Ser: 0.7 mg/dL (ref 0.44–1.00)
GFR calc Af Amer: >60 mL/min (ref >60)
GFR calc non Af Amer: >60 mL/min (ref >60)
Glucose, Bld: 103 mg/dL — ABNORMAL HIGH (ref 65–99)
Potassium: 4.1 mmol/L (ref 3.5–5.1)
Sodium: 139 mmol/L (ref 135–145)
Total Bilirubin: 0.7 mg/dL (ref 0.3–1.2)
Total Protein: 6.9 g/dL (ref 6.5–8.1)

## 2016-10-26 LAB — CBC
HCT: 35.7 % — ABNORMAL LOW (ref 36.0–46.0)
Hemoglobin: 12.1 g/dL (ref 12.0–15.0)
MCH: 31.4 pg (ref 26.0–34.0)
MCHC: 33.9 g/dL (ref 30.0–36.0)
MCV: 92.7 fL (ref 78.0–100.0)
Platelets: 264 10*3/uL (ref 150–400)
RBC: 3.85 MIL/uL — ABNORMAL LOW (ref 3.87–5.11)
RDW: 14.8 % (ref 11.5–15.5)
WBC: 8.6 10*3/uL (ref 4.0–10.5)

## 2016-10-26 LAB — MAGNESIUM: Magnesium: 5.9 mg/dL — ABNORMAL HIGH (ref 1.7–2.4)

## 2016-10-26 MED ORDER — LABETALOL HCL 200 MG PO TABS
400.0000 mg | ORAL_TABLET | Freq: Two times a day (BID) | ORAL | Status: DC
  Administered 2016-10-26: 400 mg via ORAL
  Filled 2016-10-26: qty 2

## 2016-10-26 MED ORDER — LABETALOL HCL 200 MG PO TABS: 400.0000 mg | ORAL_TABLET | Freq: Two times a day (BID) | ORAL | 1 refills | Status: AC

## 2016-10-26 NOTE — Progress Notes (Signed)
Discharge instructions reviewed with patient. Patient receptive to discharge instructions. Discharged ambulatory.

## 2016-10-26 NOTE — Progress Notes (Signed)
Patient ID: Andrea Bradley, female   DOB: 03/30/1991, 26 y.o.   MRN: [id number] HD #2 PPD #5 s/p SVD readmitted with preeclampsia with severe features.   Subjective: pt is without complaints. Denies headache visual disturbances or ruq pain. She would like to go home today if possible.   O: Vitals 98.7 HR 89 RR 16 BP 143/93  General Alert and Oriented. NAD CV rrr Lungs clear  Abdomen soft nontender  Extremities 2+ edema normal reflexes no clonus   Results for orders placed or performed during the hospital encounter of 10/25/16 (from the past 24 hour(s))  CBC     Status: Abnormal   Collection Time: 10/26/16  8:51 AM  Result Value Ref Range   WBC 8.6 4.0 - 10.5 K/uL   RBC 3.85 (L) 3.87 - 5.11 MIL/uL   Hemoglobin 12.1 12.0 - 15.0 g/dL   HCT 40.935.7 (L) 81.136.0 - 91.446.0 %   MCV 92.7 78.0 - 100.0 fL   MCH 31.4 26.0 - 34.0 pg   MCHC 33.9 30.0 - 36.0 g/dL   RDW 78.214.8 95.611.5 - 21.315.5 %   Platelets 264 150 - 400 K/uL  Comprehensive metabolic panel     Status: Abnormal   Collection Time: 10/26/16  8:51 AM  Result Value Ref Range   Sodium 139 135 - 145 mmol/L   Potassium 4.1 3.5 - 5.1 mmol/L   Chloride 103 101 - 111 mmol/L   CO2 28 22 - 32 mmol/L   Glucose, Bld 103 (H) 65 - 99 mg/dL   BUN 6 6 - 20 mg/dL   Creatinine, Ser 0.860.70 0.44 - 1.00 mg/dL   Calcium 8.0 (L) 8.9 - 10.3 mg/dL   Total Protein 6.9 6.5 - 8.1 g/dL   Albumin 2.9 (L) 3.5 - 5.0 g/dL   AST 65 (H) 15 - 41 U/L   ALT 63 (H) 14 - 54 U/L   Alkaline Phosphatase 124 38 - 126 U/L   Total Bilirubin 0.7 0.3 - 1.2 mg/dL   GFR calc non Af Amer >60 >60 mL/min   GFR calc Af Amer >60 >60 mL/min   Anion gap 8 5 - 15  Magnesium     Status: Abnormal   Collection Time: 10/26/16  8:51 AM  Result Value Ref Range   Magnesium 5.9 (H) 1.7 - 2.4 mg/dL   Assessment: PPD #5 preeclampsia with severe features - Improving. LFT's decreasing. BP improved.   Plan Magnesium discontinued at 1115 am.   Labetalol increased to 400 mg bid  IF BP normal this  afternoon plan discharge home.   follow up in office Friday for BP check .

## 2016-11-09 NOTE — Discharge Summary (Signed)
Physician Discharge Summary  Patient ID: Andrea Bradley MRN: [id number] DOB/AGE: 26/01/1991 26 y.o.  Admit date: 10/25/2016 Discharge date: 11/09/2016  Admission Diagnoses: postpartum preeclampsia   Discharge Diagnoses: postpartum preeclampsia  Active Problems:   Preeclampsia in postpartum period   Discharged Condition: stable  Hospital Course: Patient was admitted on 6//09/2016 due to severe range blood pressures and increased Liver function test.  He received magnesium sulfate for 24 hours. She was started on labetalol 400 mg bid for blood pressure control. She improved and was discharged home on Hospital day #2.   Consults: None  Significant Diagnostic Studies: labs: AST 121 ALT 78 Treatments: magnesium sulfate and antihypertensives   Discharge Exam: Blood pressure 140/84, pulse 90, temperature 98.7 F (37.1 C), temperature source Oral, resp. rate 16, height 5\' 2"  (1.575 m), weight 82.6 kg (182 lb), SpO2 99 %, unknown if currently breastfeeding. General appearance: alert, cooperative and no distress Resp: good effort no distress GI: soft nontender nondistended Extremities: extremities normal, atraumatic, no cyanosis or edema  Disposition: 01-Home or Self Care  Discharge Instructions    Call MD for:  difficulty breathing, headache or visual disturbances    Complete by:  As directed    Call MD for:  persistant nausea and vomiting    Complete by:  As directed    Call MD for:  redness, tenderness, or signs of infection (pain, swelling, redness, odor or green/yellow discharge around incision site)    Complete by:  As directed    Call MD for:  severe uncontrolled pain    Complete by:  As directed    Call MD for:  temperature >100.4    Complete by:  As directed    Diet - low sodium heart healthy    Complete by:  As directed    Discontinue IV    Complete by:  As directed    Increase activity slowly    Complete by:  As directed    Sexual Activity Restrictions    Complete by:   As directed    Avoid sex for 6 weeks     Allergies as of 10/26/2016   No Known Allergies     Medication List    TAKE these medications   ferrous sulfate 325 (65 FE) MG tablet Take 1 tablet (325 mg total) by mouth 2 (two) times daily with a meal.   ibuprofen 600 MG tablet Commonly known as:  ADVIL,MOTRIN Take 1 tablet (600 mg total) by mouth every 6 (six) hours.   labetalol 200 MG tablet Commonly known as:  NORMODYNE Take 2 tablets (400 mg total) by mouth 2 (two) times daily. What changed:  medication strength  how much to take   prenatal multivitamin Tabs tablet Take 1 tablet by mouth daily at 12 noon.      Follow-up Information    Gerald Leitzole, Jaleeyah Munce, MD Follow up on 10/27/2016.   Specialty:  Obstetrics and Gynecology Why:  appointment 10/27/2016 at 11 am for blood pressure check  Contact information: [address] [place] [address] [phone]           Signed: Jessee AversCOLE,Jackey Housey J. 11/09/2016, 9:34 PM

## 2017-06-30 ENCOUNTER — Other Ambulatory Visit: Payer: Self-pay | Admitting: Family Medicine

## 2018-02-13 IMAGING — CR DG CHEST 2V
2 series · 2 of 2 positions shown · non-contrast
Comparison: None.

CLINICAL DATA: Shortness of breath.

EXAM:
CHEST  2 VIEW

[chest pa]
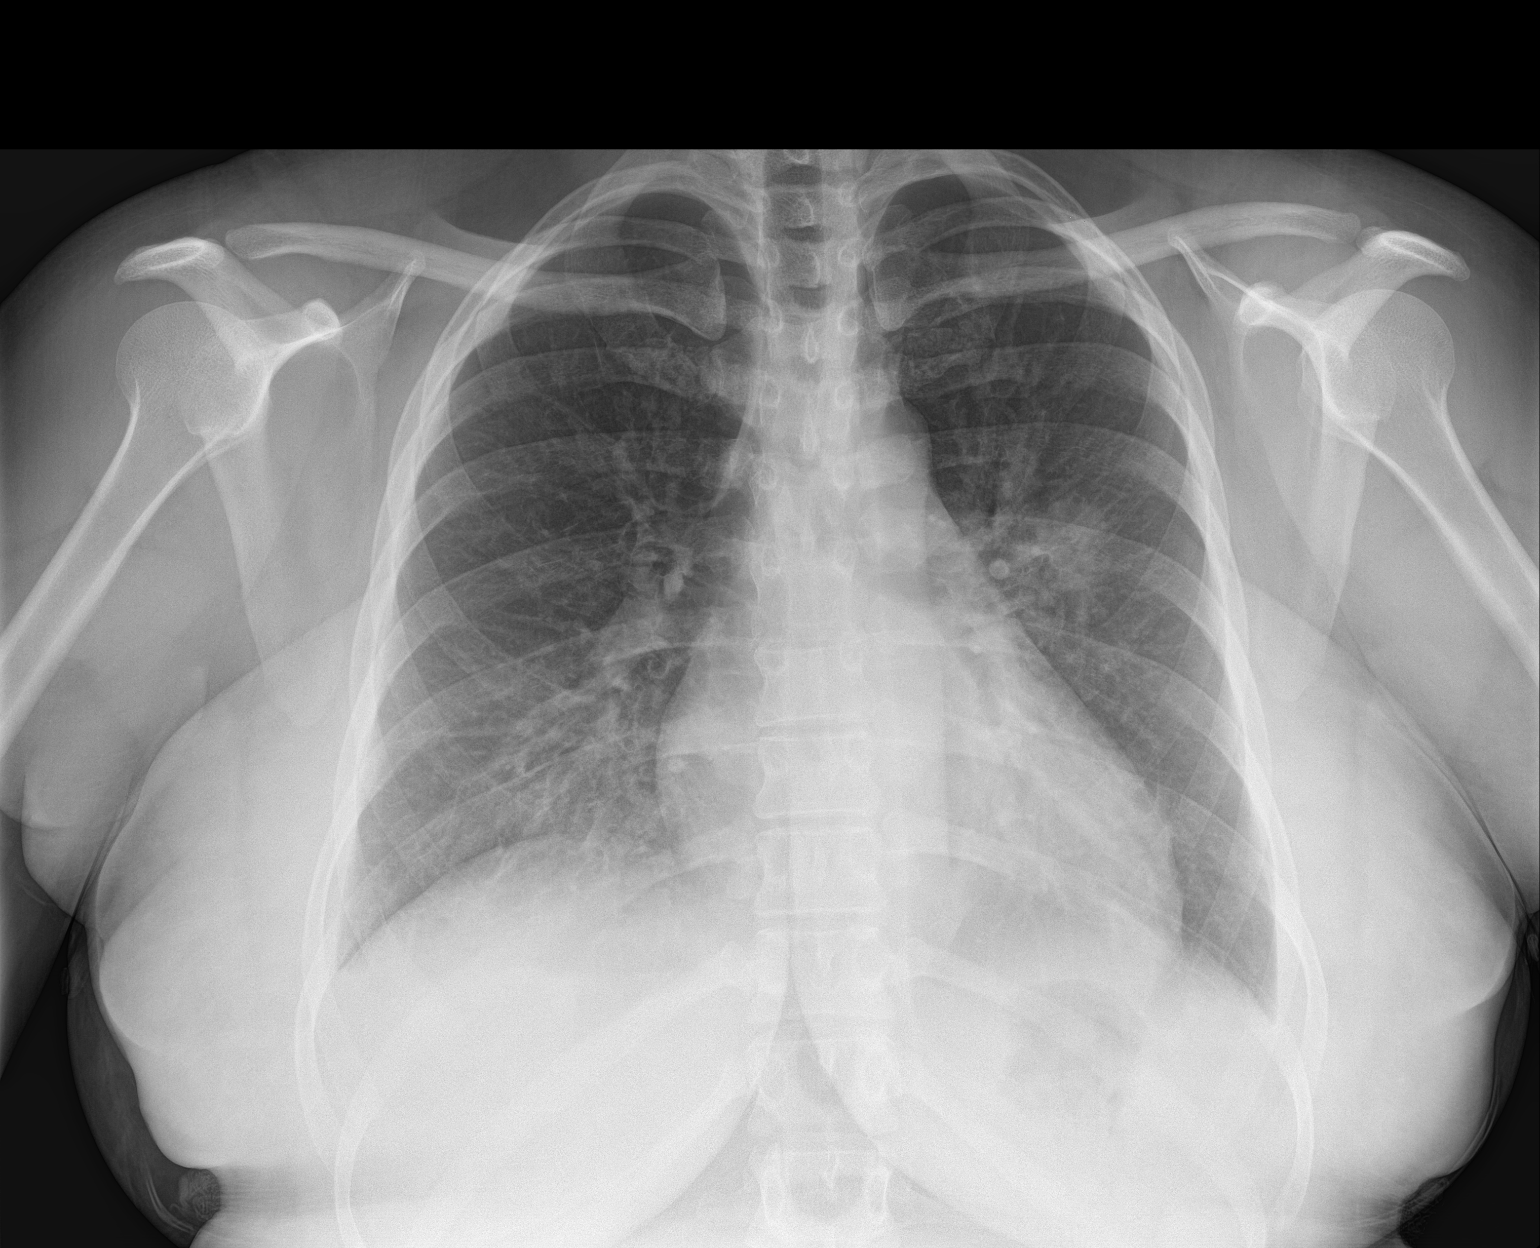

[chest lat]
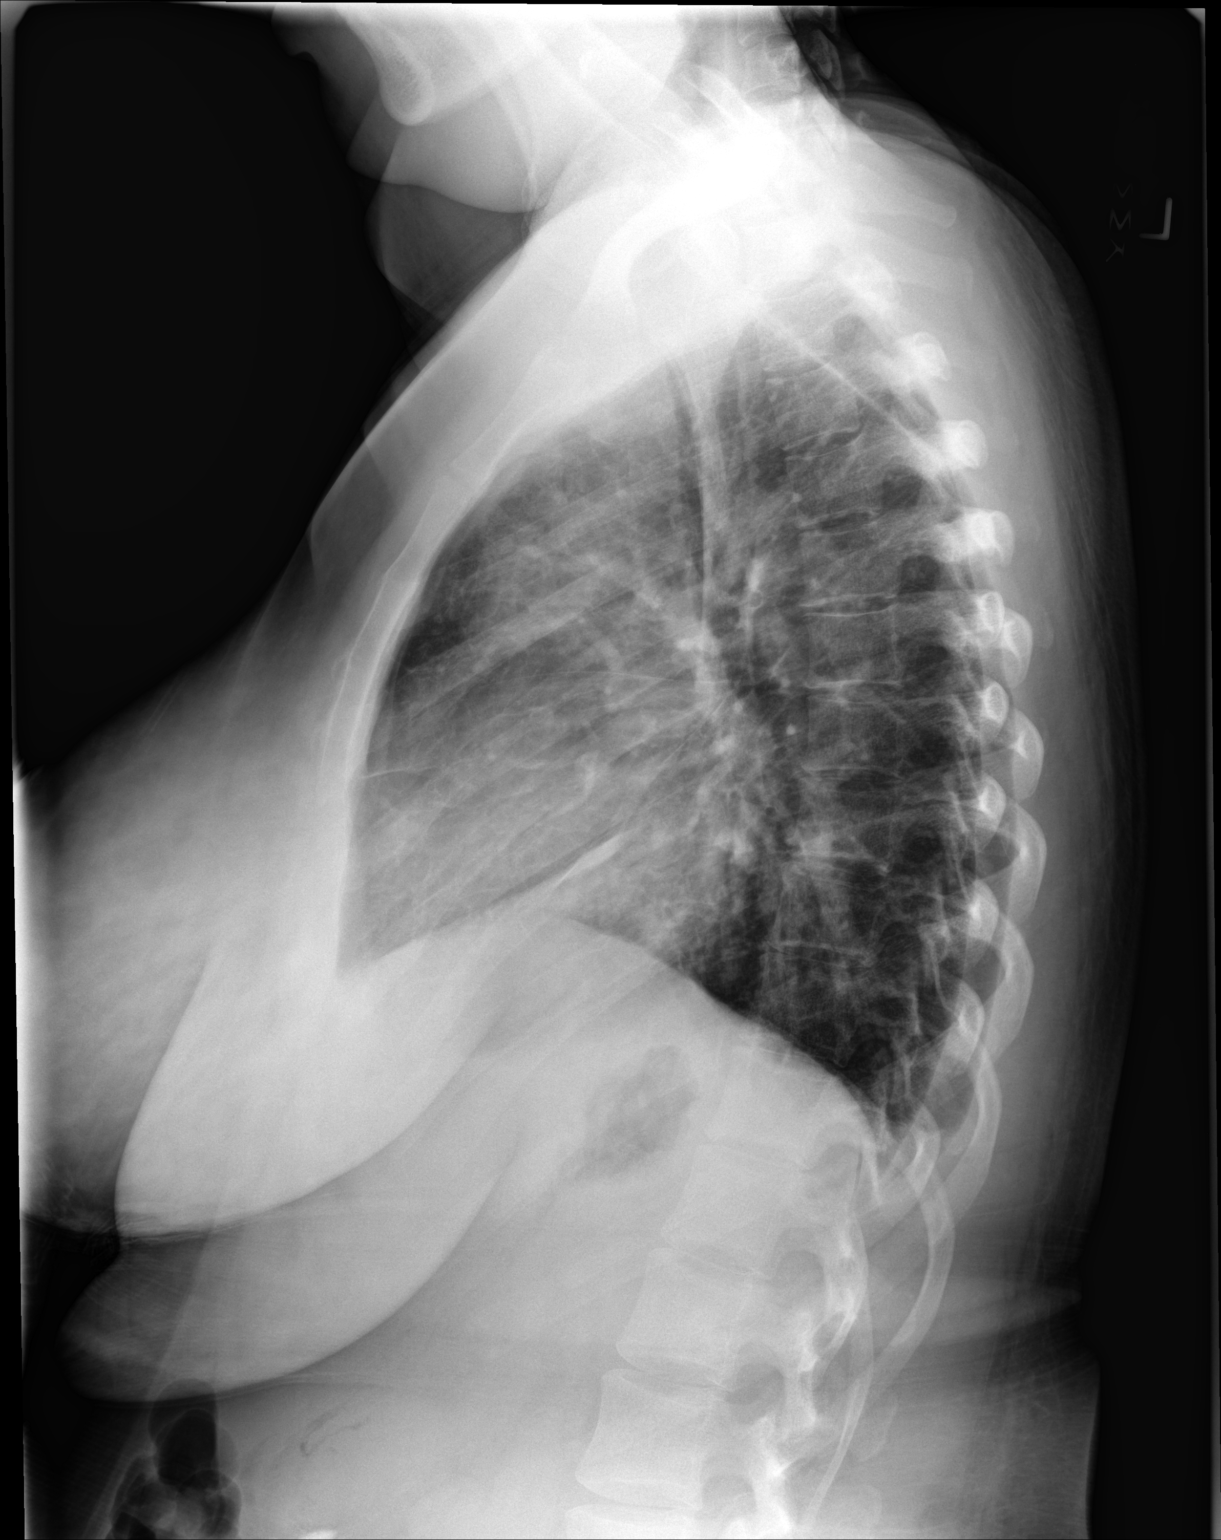

[2 of 2 positions shown; findings below may reference images not displayed]

FINDINGS: The heart size is mildly enlarged. The mediastinal contours are
within normal limits. Left upper lobe perihilar opacity measures
cm. Additionally, there is the opacification within the lingula. The
visualized skeletal structures are unremarkable.
IMPRESSION: 1. Airspace opacities within the left upper lobe and lingula
worrisome for pneumonia.

## 2018-04-13 ENCOUNTER — Other Ambulatory Visit: Payer: Self-pay | Admitting: Obstetrics and Gynecology

## 2018-04-13 ENCOUNTER — Other Ambulatory Visit (HOSPITAL_COMMUNITY)
Admission: RE | Admit: 2018-04-13 | Discharge: 2018-04-13 | Disposition: A | Discharge status: Home / Self Care | Payer: BC Managed Care – PPO | Source: Ambulatory Visit | Attending: Obstetrics and Gynecology | Admitting: Obstetrics and Gynecology

## 2018-04-13 DIAGNOSIS — Z01419 Encounter for gynecological examination (general) (routine) without abnormal findings: Secondary | ICD-10-CM | POA: Insufficient documentation

## 2018-04-16 LAB — CYTOLOGY - PAP: Diagnosis: NEGATIVE

## 2019-11-02 ENCOUNTER — Ambulatory Visit: Payer: BC Managed Care – PPO | Attending: Internal Medicine

## 2019-11-02 DIAGNOSIS — Z23 Encounter for immunization: Secondary | ICD-10-CM

## 2019-11-02 NOTE — Progress Notes (Signed)
   Covid-19 Vaccination Clinic  Name:  Andrea Bradley    MRN: [id number] DOB: 07/08/1990  11/02/2019  [name] was observed post Covid-19 immunization for 15 minutes without incident. She was provided with Vaccine Information Sheet and instruction to access the V-Safe system.   [name] was instructed to call 911 with any severe reactions post vaccine: Marland Kitchen Difficulty breathing  . Swelling of face and throat  . A fast heartbeat  . A bad rash all over body  . Dizziness and weakness   Immunizations Administered    Name Date Dose VIS Date Route   Pfizer COVID-19 Vaccine 11/02/2019 11:25 AM 0.3 mL 07/17/2018 Intramuscular   Manufacturer: [employer], [employer]   Lot: [id number]   NDC: [address]-7

## 2019-11-28 ENCOUNTER — Ambulatory Visit: Payer: BC Managed Care – PPO | Attending: Internal Medicine

## 2019-11-28 DIAGNOSIS — Z23 Encounter for immunization: Secondary | ICD-10-CM

## 2019-11-28 NOTE — Progress Notes (Signed)
   Covid-19 Vaccination Clinic  Name:  TIMBERLEE ROBLERO    MRN: [id number] DOB: 01/19/1991  11/28/2019  Ms. Ozbun was observed post Covid-19 immunization for 15 minutes without incident. She was provided with Vaccine Information Sheet and instruction to access the V-Safe system.   Ms. Kurtenbach was instructed to call 911 with any severe reactions post vaccine: Marland Kitchen Difficulty breathing  . Swelling of face and throat  . A fast heartbeat  . A bad rash all over body  . Dizziness and weakness   Immunizations Administered    Name Date Dose VIS Date Route   Pfizer COVID-19 Vaccine 11/28/2019  8:12 AM 0.3 mL 07/17/2018 Intramuscular   Manufacturer: [employer], [employer]   Lot: [id number]   NDC: [address]-0

## 2020-05-31 ENCOUNTER — Other Ambulatory Visit: Payer: Self-pay

## 2020-05-31 DIAGNOSIS — Z20822 Contact with and (suspected) exposure to covid-19: Secondary | ICD-10-CM

## 2020-06-02 LAB — NOVEL CORONAVIRUS, NAA: SARS-CoV-2, NAA: NOT DETECTED

## 2020-06-02 LAB — SARS-COV-2, NAA 2 DAY TAT

## 2021-11-08 ENCOUNTER — Other Ambulatory Visit: Payer: Self-pay | Admitting: Family Medicine

## 2021-11-08 DIAGNOSIS — R140 Abdominal distension (gaseous): Secondary | ICD-10-CM

## 2021-11-09 ENCOUNTER — Other Ambulatory Visit: Payer: Self-pay | Admitting: Obstetrics and Gynecology

## 2021-11-09 ENCOUNTER — Other Ambulatory Visit: Payer: Self-pay

## 2021-11-09 ENCOUNTER — Other Ambulatory Visit (HOSPITAL_COMMUNITY)
Admission: RE | Admit: 2021-11-09 | Discharge: 2021-11-09 | Disposition: A | Discharge status: Home / Self Care | Payer: BC Managed Care – PPO | Source: Ambulatory Visit | Attending: Obstetrics and Gynecology | Admitting: Obstetrics and Gynecology

## 2021-11-09 DIAGNOSIS — Z124 Encounter for screening for malignant neoplasm of cervix: Secondary | ICD-10-CM | POA: Diagnosis present

## 2021-11-10 LAB — CYTOLOGY - PAP
Comment: NEGATIVE
Diagnosis: NEGATIVE
High risk HPV: NEGATIVE

## 2021-11-16 ENCOUNTER — Ambulatory Visit
Admission: RE | Admit: 2021-11-16 | Discharge: 2021-11-16 | Disposition: A | Discharge status: Home / Self Care | Payer: BC Managed Care – PPO | Source: Ambulatory Visit | Attending: Family Medicine | Admitting: Family Medicine

## 2021-11-16 DIAGNOSIS — R140 Abdominal distension (gaseous): Secondary | ICD-10-CM
